# Patient Record
Sex: Male | Born: 1945 | Race: White | Hispanic: No | Smoking: Never smoker
Health system: Southern US, Community
[De-identification: ages and names within clinical notes are randomized; demographics above are authoritative.]

## PROBLEM LIST (undated history)

## (undated) DIAGNOSIS — M1990 Unspecified osteoarthritis, unspecified site: Secondary | ICD-10-CM

## (undated) DIAGNOSIS — E119 Type 2 diabetes mellitus without complications: Secondary | ICD-10-CM

## (undated) DIAGNOSIS — K769 Liver disease, unspecified: Secondary | ICD-10-CM

## (undated) DIAGNOSIS — E785 Hyperlipidemia, unspecified: Secondary | ICD-10-CM

## (undated) DIAGNOSIS — T7840XA Allergy, unspecified, initial encounter: Secondary | ICD-10-CM

## (undated) DIAGNOSIS — I10 Essential (primary) hypertension: Secondary | ICD-10-CM

## (undated) HISTORY — PX: HAND SURGERY: SHX662

## (undated) HISTORY — DX: Type 2 diabetes mellitus without complications: E11.9

## (undated) HISTORY — DX: Essential (primary) hypertension: I10

## (undated) HISTORY — DX: Allergy, unspecified, initial encounter: T78.40XA

## (undated) HISTORY — DX: Liver disease, unspecified: K76.9

## (undated) HISTORY — DX: Hyperlipidemia, unspecified: E78.5

## (undated) HISTORY — PX: FOOT SURGERY: SHX648

## (undated) HISTORY — PX: OTHER SURGICAL HISTORY: SHX169

## (undated) HISTORY — DX: Unspecified osteoarthritis, unspecified site: M19.90

## (undated) HISTORY — PX: DUPUYTREN CONTRACTURE RELEASE: SHX1478

---
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
## 1984-11-25 HISTORY — PX: VASECTOMY: SHX75

## 1996-11-25 HISTORY — PX: ANTERIOR FUSION CERVICAL SPINE: SUR626

## 2000-11-25 HISTORY — PX: KNEE ARTHROSCOPY WITH MEDIAL MENISECTOMY: SHX5651

## 2001-11-25 HISTORY — PX: ROTATOR CUFF REPAIR: SHX139

## 2002-11-25 HISTORY — PX: CATARACT EXTRACTION: SUR2

## 2016-03-03 HISTORY — PX: ULNAR TUNNEL RELEASE: SHX820

## 2019-02-19 HISTORY — PX: XI ROBOTIC ASSISTED SIMPLE PROSTATECTOMY: SHX6713

## 2019-07-16 HISTORY — PX: PIP JOINT FUSION: SHX2238

## 2020-12-22 ENCOUNTER — Telehealth: Payer: Self-pay

## 2020-12-22 NOTE — Telephone Encounter (Signed)
Called pt to let him know that we are currently not accepting new pt's.

## 2020-12-22 NOTE — Telephone Encounter (Signed)
Copied from [place] [id number]. Topic: General - Other >> Dec 22, 2020  2:23 PM Antonieta Iba C wrote: Reason for CRM: pt is calling in to schedule a np apt with a provider, pt says that he doesn't have a preference.  Pt says that he is new to the area and has [employer].    CB: [phone] okay to lvm --please assist pt further.

## 2020-12-28 DIAGNOSIS — R69 Illness, unspecified: Secondary | ICD-10-CM | POA: Diagnosis not present

## 2020-12-29 ENCOUNTER — Ambulatory Visit (INDEPENDENT_AMBULATORY_CARE_PROVIDER_SITE_OTHER): Payer: Medicare HMO | Admitting: Family Medicine

## 2020-12-29 ENCOUNTER — Other Ambulatory Visit: Payer: Self-pay

## 2020-12-29 ENCOUNTER — Encounter: Payer: Self-pay | Admitting: Family Medicine

## 2020-12-29 VITALS — BP 143/96 | HR 130 | Ht 75.0 in | Wt 215.6 lb

## 2020-12-29 DIAGNOSIS — F5101 Primary insomnia: Secondary | ICD-10-CM

## 2020-12-29 DIAGNOSIS — Z7689 Persons encountering health services in other specified circumstances: Secondary | ICD-10-CM | POA: Diagnosis not present

## 2020-12-29 DIAGNOSIS — R69 Illness, unspecified: Secondary | ICD-10-CM | POA: Diagnosis not present

## 2020-12-29 DIAGNOSIS — I10 Essential (primary) hypertension: Secondary | ICD-10-CM | POA: Insufficient documentation

## 2020-12-29 MED ORDER — ZOLPIDEM TARTRATE 10 MG PO TABS: 10.0000 mg | ORAL_TABLET | Freq: Every evening | ORAL | 3 refills | Status: DC | PRN

## 2020-12-29 MED ORDER — CYCLOBENZAPRINE HCL 10 MG PO TABS: 10.0000 mg | ORAL_TABLET | Freq: Three times a day (TID) | ORAL | 0 refills | Status: DC | PRN

## 2020-12-29 NOTE — Assessment & Plan Note (Signed)
Has been on [place] for 23 years. Refilled ambien.

## 2020-12-29 NOTE — Progress Notes (Signed)
Established Patient Office Visit  SUBJECTIVE:  Subjective  Patient ID: Joseph Howell, male    DOB: [birth year]  Age: 75 y.o. MRN: [id number]  CC:  Chief Complaint  Patient presents with  . New Patient (Initial Visit)    New patient to establish care     HPI Joseph Howell is a 75 y.o. male presenting today for     Past Medical History:  Diagnosis Date  . Allergy   . Arthritis   . Diabetes mellitus without complication ([place])   . Hyperlipidemia   . Hypertension   . Liver disease, unspecified     Past Surgical History:  Procedure Laterality Date  . [place]   C-5/6 & 6/7  . CATARACT EXTRACTION  2004  . 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 HAND SURGERY  [phone]    Multiple (Dupuytrens Contracture)  . KNEE ARTHROSCOPY WITH MEDIAL MENISECTOMY Left 2002  . PIP JOINT FUSION Right 07/16/2019  . ROTATOR CUFF REPAIR Left 2003  . TONSILLECTOMY AND ADENOIDECTOMY  1951  . ULNAR TUNNEL RELEASE Left 03/03/2016  . VASECTOMY  1986  . XI ROBOTIC ASSISTED SIMPLE PROSTATECTOMY  02/19/2019    Family History  Adopted: Yes  Family history unknown: Yes    Social History   Socioeconomic History  . Marital status: Divorced    Spouse name: Not on file  . Number of children: Not on file  . Years of education: Not on file  . Highest education level: Not on file  Occupational History  . Not on file  Tobacco Use  . Smoking status: Never Smoker  . Smokeless tobacco: Never Used  Vaping Use  . Vaping Use: Never used  Substance and Sexual Activity  . Alcohol use: Yes    Comment: 2 daily  . Drug use: Never  . Sexual activity: Not Currently  Other Topics Concern  . Not on file  Social History Narrative  . Not on file   Social Determinants of Health   Financial Resource Strain: Not on file  Food Insecurity: Not on file  Transportation Needs: Not on file   Physical Activity: Not on file  Stress: Not on file  Social Connections: Not on file  Intimate Partner Violence: Not on file     Current Outpatient Medications:  .  cyclobenzaprine (FLEXERIL) 10 MG tablet, Take 1 tablet (10 mg total) by mouth 3 (three) times daily as needed for muscle spasms., Disp: 30 tablet, Rfl: 0 .  lisinopril (ZESTRIL) 20 MG tablet, Take 20 mg by mouth daily., Disp: , Rfl:  .  metFORMIN (GLUCOPHAGE) 500 MG tablet, Take 500 mg by mouth daily., Disp: , Rfl:  .  zolpidem (AMBIEN) 10 MG tablet, Take 1 tablet (10 mg total) by mouth at bedtime as needed., Disp: 30 tablet, Rfl: 3   No Known Allergies  ROS Review of Systems  Constitutional: Negative.   HENT: Negative.   Respiratory: Negative.   Cardiovascular: Negative.   Gastrointestinal: Negative.   Genitourinary: Negative.   Musculoskeletal: Negative.   Skin: Negative.   Neurological: Negative.   Psychiatric/Behavioral: Negative.      OBJECTIVE:    Physical Exam Vitals and nursing note reviewed.  HENT:     Head: Normocephalic.     Right Ear: Tympanic membrane normal.     Left Ear: Tympanic membrane normal.  Cardiovascular:     Rate and Rhythm: Normal rate  and regular rhythm.     Pulses: Normal pulses.  Musculoskeletal:     Cervical back: Normal range of motion.  Skin:    General: Skin is warm.     Capillary Refill: Capillary refill takes less than 2 seconds.  Neurological:     Mental Status: He is alert.  Psychiatric:        Mood and Affect: Mood normal.     BP (!) 143/96   Pulse (!) 130   Ht _0  (1.905 m)   Wt 215 lb 9.6 oz (97.8 kg)   BMI 26.95 kg/m  Wt Readings from Last 3 Encounters:  12/29/20 215 lb 9.6 oz (97.8 kg)    Health Maintenance Due  Topic Date Due  . Hepatitis C Screening  Never done  . COLONOSCOPY (Pts 45-38yr Insurance coverage will need to be confirmed)  Never done  . PNA vac Low Risk Adult (1 of 2 - PCV13) Never done  . COVID-19 Vaccine (3 - Moderna risk  4-dose series) 02/09/2020  . INFLUENZA VACCINE  06/25/2020    There are no preventive care reminders to display for this patient.  No flowsheet data found. No flowsheet data found.  No results found for: TSH No results found for: ALBUMIN, ANIONGAP, EGFR, GFR No results found for: CHOL, HDL, LDLCALC, CHOLHDL No results found for: TRIG No results found for: HGBA1C    ASSESSMENT & PLAN:   Problem List Items Addressed This Visit      Cardiovascular and Mediastinum   Primary hypertension    Not at goal today, taking all meds, fu 3 months.       Relevant Medications   lisinopril (ZESTRIL) 20 MG tablet     Other   Encounter to establish care - Primary   Primary insomnia    Has been on ambien for 23 years. Refilled ambien.          Meds ordered this encounter  Medications  . zolpidem (AMBIEN) 10 MG tablet    Sig: Take 1 tablet (10 mg total) by mouth at bedtime as needed.    Dispense:  30 tablet    Refill:  3  . cyclobenzaprine (FLEXERIL) 10 MG tablet    Sig: Take 1 tablet (10 mg total) by mouth 3 (three) times daily as needed for muscle spasms.    Dispense:  30 tablet    Refill:  0      Follow-up: No follow-ups on file.    KBeckie Salts [id number] [place] [id number]

## 2020-12-29 NOTE — Assessment & Plan Note (Addendum)
Not at goal today, taking all meds, fu 3 months. Discussed diet and decreased salt intake.

## 2021-01-01 DIAGNOSIS — M720 Palmar fascial fibromatosis [Dupuytren]: Secondary | ICD-10-CM | POA: Diagnosis not present

## 2021-02-06 DIAGNOSIS — Z7984 Long term (current) use of oral hypoglycemic drugs: Secondary | ICD-10-CM | POA: Diagnosis not present

## 2021-02-06 DIAGNOSIS — G8918 Other acute postprocedural pain: Secondary | ICD-10-CM | POA: Diagnosis not present

## 2021-02-06 DIAGNOSIS — E119 Type 2 diabetes mellitus without complications: Secondary | ICD-10-CM | POA: Diagnosis not present

## 2021-02-06 DIAGNOSIS — I10 Essential (primary) hypertension: Secondary | ICD-10-CM | POA: Diagnosis not present

## 2021-02-06 DIAGNOSIS — E785 Hyperlipidemia, unspecified: Secondary | ICD-10-CM | POA: Diagnosis not present

## 2021-02-06 DIAGNOSIS — M720 Palmar fascial fibromatosis [Dupuytren]: Secondary | ICD-10-CM | POA: Diagnosis not present

## 2021-02-06 DIAGNOSIS — Z79899 Other long term (current) drug therapy: Secondary | ICD-10-CM | POA: Diagnosis not present

## 2021-02-26 DIAGNOSIS — M720 Palmar fascial fibromatosis [Dupuytren]: Secondary | ICD-10-CM | POA: Diagnosis not present

## 2021-02-26 DIAGNOSIS — M6281 Muscle weakness (generalized): Secondary | ICD-10-CM | POA: Diagnosis not present

## 2021-03-05 ENCOUNTER — Ambulatory Visit: Payer: Medicare HMO | Admitting: Occupational Therapy

## 2021-03-05 DIAGNOSIS — H0100A Unspecified blepharitis right eye, upper and lower eyelids: Secondary | ICD-10-CM | POA: Diagnosis not present

## 2021-03-06 ENCOUNTER — Encounter: Payer: Self-pay | Admitting: Occupational Therapy

## 2021-03-06 ENCOUNTER — Ambulatory Visit: Payer: Medicare HMO | Attending: Orthopedic Surgery | Admitting: Occupational Therapy

## 2021-03-06 ENCOUNTER — Other Ambulatory Visit: Payer: Self-pay

## 2021-03-06 DIAGNOSIS — M720 Palmar fascial fibromatosis [Dupuytren]: Secondary | ICD-10-CM | POA: Insufficient documentation

## 2021-03-06 DIAGNOSIS — M25641 Stiffness of right hand, not elsewhere classified: Secondary | ICD-10-CM

## 2021-03-06 DIAGNOSIS — M79641 Pain in right hand: Secondary | ICD-10-CM

## 2021-03-06 DIAGNOSIS — M6281 Muscle weakness (generalized): Secondary | ICD-10-CM | POA: Diagnosis not present

## 2021-03-06 DIAGNOSIS — L905 Scar conditions and fibrosis of skin: Secondary | ICD-10-CM | POA: Diagnosis not present

## 2021-03-06 NOTE — Therapy (Signed)
[facility] [address] [place], [address] Phone: [phone]   Fax:  [phone]  Occupational Therapy Treatment  Patient Details  Name: Joseph Howell MRN: [id number] Date of Birth: 03-01-1946 Referring Provider (OT): DR Draeger   Encounter Date: 03/06/2021   OT End of Session - 03/06/21 1214    Visit Number 1    Number of Visits 12    Date for OT Re-Evaluation 05/01/21    OT Start Time 0950    OT Stop Time 1035    OT Time Calculation (min) 45 min    Activity Tolerance Patient tolerated treatment well    Behavior During Therapy [facility] for tasks assessed/performed           Past Medical History:  Diagnosis Date  . Allergy   . Arthritis   . Diabetes mellitus without complication ([place])   . Hyperlipidemia   . Hypertension   . Liver disease, unspecified     Past Surgical History:  Procedure Laterality Date  . [place]   C-5/6 & 6/7  . CATARACT EXTRACTION  2004  . 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 dupuytrens release R hand 02/06/21 Right   . 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 HAND SURGERY  [phone]    Multiple (Dupuytrens Contracture)  . KNEE ARTHROSCOPY WITH MEDIAL MENISECTOMY Left 2002  . PIP JOINT FUSION Right 07/16/2019  . ROTATOR CUFF REPAIR Left 2003  . TONSILLECTOMY AND ADENOIDECTOMY  1951  . ULNAR TUNNEL RELEASE Left 03/03/2016  . VASECTOMY  1986  . XI ROBOTIC ASSISTED SIMPLE PROSTATECTOMY  02/19/2019    There were no vitals filed for this visit.   Subjective Assessment - 03/06/21 1225    Subjective  I had several surgeries since my lat 20's for this dupuytrens in both hands -about every 10 yrs- very happy with this surgery - need you help with scar massage and motion    Pertinent History 02/06/21 had Successful right thumb Dupuytren's fasciectomy with excision of large pretendinous cord and nodule  with successful primary closure while protecting the radial and ulnar neurovascular bundles. Successful right small finger Dupuytren's fasciectomy with excision of large ADM cord and pretendinous/spiral cord that was excised while protecting the radial and ulnar neurovascular bundles. Successful right ring finger palmar Dupuytren's fasciectomy with excision of taut pretendinous cord while protecting the radial and ulnar neurovascular bundles.  - 02/26/21 stitches come out and seen OT for splint for night time 3-4 months to wear and HEP started -and refer to OT    Patient Stated Goals Want to be able to use my hands again to grasp things, wash face, donn gloves and maybe work litlte on the side again    Currently in Pain? Yes    Pain Score 1     Pain Location Hand    Pain Orientation Right    Pain Descriptors / Indicators Sore    Pain Type Surgical pain    Pain Onset 1 to 4 weeks ago    Pain Frequency Intermittent              [facility] OT Assessment - 03/06/21 0001      Assessment   Medical Diagnosis R hand thumb , 4th and 5th digit dupuytrens release    Referring Provider (OT) DR Draeger    Onset Date/Surgical Date 02/06/21    Hand Dominance Right  Next MD Visit 03/26/21      Precautions   Required Braces or Orthoses --   extention splint to hand night time     Home  Environment   Lives With Alone      Prior Function   Vocation Retired    [employer] Ret[employer] , moved last year from [place],      Right Hand AROM   R Thumb Radial ABduction/ADduction 0-55 52    R Thumb Palmar ABduction/ADduction 0-45 70    R Thumb Opposition to Index --   opposition to base of 5th   R Index  MCP 0-90 80 Degrees    R Index PIP 0-100 90 Degrees    R Long  MCP 0-90 80 Degrees    R Long PIP 0-100 100 Degrees    R Ring  MCP 0-90 80 Degrees   -20   R Ring PIP 0-100 100 Degrees   -20   R Little  MCP 0-90 75 Degrees   -35   R Little PIP 0-100 65 Degrees   -20                   OT  Treatments/Exercises (OP) - 03/06/21 0001      RUE Contrast Bath   Time 8 minutes    Comments LMB splint on 5th for PIP extention           Contrast 3 x day Scar massage -and night time cica scar pad and silicon sleeve on 5th digit USE LMB splint on 5th PIP during contrast or after wards - 3 min at time  To 5 min pain free  PROM and extention stretch to diigts on table  And then AAROM on table  Rolling over putty for extention and massage to volar scars 20 reps pain free  Tendon glides AROM - 10 reps And opposition to all digits AROM - slide down 5th  PA and RA of thumb - 10 reps  2-3 x day          OT Education - 03/06/21 1214    Education Details findings of eval and HEP    Methods Explanation;Demonstration;Tactile cues;Verbal cues;Handout    Comprehension Verbal cues required;Returned demonstration;Verbalized understanding            OT Short Term Goals - 03/06/21 1220      OT SHORT TERM GOAL #1   Title Exention of R digits improve to WNL to be able to wash face and donn gloves    Baseline MC 4th -20 , PIP -20 and 5th MC -35, PIP -20 - had several surgies thru the years - for dupuytrens    Time 4    Period Weeks    Status New    Target Date 04/03/21             OT Long Term Goals - 03/06/21 1221      OT LONG TERM GOAL #1   Title R hand scar tissue and edema decrease for pt to show increase AROM to be able to touch palm    Baseline 5th MC flexion 75, PIP 65 , and MC's of other digits 80    Time 4    Period Weeks    Status New    Target Date 04/03/21      OT LONG TERM GOAL #2   Title Pt AROM in R hand improve for pt to touch palm to use in ADL's/IADlLs  and grasp utencils and  and tools , unpack boxes    Baseline AROM 5th PIP 65, MC 75 , other MC's 80's -and scar still thick and tender after some ROM and massage    Time 6    Period Weeks    Status New    Target Date 04/17/21      OT LONG TERM GOAL #3   Title R grip and prehension strength  increase to morethan 75% compare to L hand to grip tools and utenciles and return to prior level of function    Baseline scars still thick , and 1 scabs - decrease extention and flexion - soreness and edema - today 4  wks s/p    Time 8    Period Weeks    Status New    Target Date 05/01/21                 Plan - 03/06/21 1230    Clinical Impression Statement Pt present at OT eval 4 wks s/p R Successful right thumb Dupuytren's fasciectomy with excision of large pretendinous cord and nodule with successful primary closure while protecting the radial and ulnar neurovascular bundles. Successful right small finger Dupuytren's fasciectomy with excision of large ADM cord and pretendinous/spiral cord that was excised while protecting the radial and ulnar neurovascular bundles. Successful right ring finger palmar Dupuytren's fasciectomy with excision of taut pretendinous cord while protecting the radial and ulnar neurovascular bundles. Had in past several surgeries for same diagnosis- pt present at OT eval with scar tissue , decrease extnetion and lfexion of 5th more than other MC's - soreness and edema - decreasing his use of R dominant hand in ADL's and IADL's - pt can benefit from OT services    OT Occupational Profile and History Problem Focused Assessment - Including review of records relating to presenting problem    Occupational performance deficits (Please refer to evaluation for details): ADL's;IADL's;Leisure;Social Participation;Play    Body Structure / Function / Physical Skills ADL;Flexibility;Sensation;Scar mobility;ROM;Edema;Strength;Pain;UE functional use    Rehab Potential Good    Clinical Decision Making Limited treatment options, no task modification necessary    Comorbidities Affecting Occupational Performance: May have comorbidities impacting occupational performance   had several surgeries for same diagnosis   OT Frequency 2x / week    OT Duration 8 weeks    OT  Treatment/Interventions Self-care/ADL training;Paraffin;Moist Heat;Fluidtherapy;Contrast Bath;Ultrasound;Therapeutic exercise;Splinting;Patient/family education;Manual Therapy;Scar mobilization    Consulted and Agree with Plan of Care Patient           Patient will benefit from skilled therapeutic intervention in order to improve the following deficits and impairments:   Body Structure / Function / Physical Skills: ADL,Flexibility,Sensation,Scar mobility,ROM,Edema,Strength,Pain,UE functional use       Visit Diagnosis: Dupuytren contracture - Plan: Ot plan of care cert/re-cert  Stiffness of right hand, not elsewhere classified - Plan: Ot plan of care cert/re-cert  Scar condition and fibrosis of skin - Plan: Ot plan of care cert/re-cert  Pain in right hand - Plan: Ot plan of care cert/re-cert  Muscle weakness (generalized) - Plan: Ot plan of care cert/re-cert    Problem List Patient Active Problem List   Diagnosis Date Noted  . Encounter to establish care 12/29/2020  . Primary insomnia 12/29/2020  . Primary hypertension 12/29/2020    Rosalyn Gess OTR/l,CLT 03/06/2021, 12:35 PM  [facility] [address], [place], [address] Phone: [phone]   Fax:  [phone]  Name: Joseph Howell MRN: [id number]  Date of Birth: February 09, 1946

## 2021-03-06 NOTE — Patient Instructions (Signed)
Contrast 3 x day Scar massage -and night time cica scar pad and silicon sleeve on 5th digit USE LMB splint on 5th PIP during contrast or after wards - 3 min at time  To 5 min pain free  PROM and extention stretch to diigts on table  And then AAROM on table  Rolling over putty for extention and massage to volar scars 20 reps pain free  Tendon glides AROM - 10 reps And opposition to all digits AROM - slide down 5th  PA and RA of thumb - 10 reps  2-3 x day

## 2021-03-12 ENCOUNTER — Ambulatory Visit: Payer: Medicare HMO | Admitting: Occupational Therapy

## 2021-03-12 ENCOUNTER — Other Ambulatory Visit: Payer: Self-pay

## 2021-03-12 DIAGNOSIS — M79641 Pain in right hand: Secondary | ICD-10-CM | POA: Diagnosis not present

## 2021-03-12 DIAGNOSIS — M720 Palmar fascial fibromatosis [Dupuytren]: Secondary | ICD-10-CM | POA: Diagnosis not present

## 2021-03-12 DIAGNOSIS — M6281 Muscle weakness (generalized): Secondary | ICD-10-CM | POA: Diagnosis not present

## 2021-03-12 DIAGNOSIS — L905 Scar conditions and fibrosis of skin: Secondary | ICD-10-CM | POA: Diagnosis not present

## 2021-03-12 DIAGNOSIS — M25641 Stiffness of right hand, not elsewhere classified: Secondary | ICD-10-CM

## 2021-03-14 ENCOUNTER — Encounter: Payer: Self-pay | Admitting: Occupational Therapy

## 2021-03-14 NOTE — Therapy (Signed)
[facility] [address] [place], [address] Phone: [phone]   Fax:  [phone]  Occupational Therapy Treatment  Patient Details  Name: Joseph Howell MRN: [id number] Date of Birth: 1946-05-15 Referring Provider (OT): DR Draeger   Encounter Date: 03/12/2021   OT End of Session - 03/14/21 2143    Visit Number 2    Number of Visits 12    Date for OT Re-Evaluation 05/01/21    OT Start Time 1346    OT Stop Time 1430    OT Time Calculation (min) 44 min    Activity Tolerance Patient tolerated treatment well    Behavior During Therapy [facility] for tasks assessed/performed           Past Medical History:  Diagnosis Date  . Allergy   . Arthritis   . Diabetes mellitus without complication ([place])   . Hyperlipidemia   . Hypertension   . Liver disease, unspecified     Past Surgical History:  Procedure Laterality Date  . [place]   C-5/6 & 6/7  . CATARACT EXTRACTION  2004  . 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 dupuytrens release R hand 02/06/21 Right   . 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 HAND SURGERY  [phone]    Multiple (Dupuytrens Contracture)  . KNEE ARTHROSCOPY WITH MEDIAL MENISECTOMY Left 2002  . PIP JOINT FUSION Right 07/16/2019  . ROTATOR CUFF REPAIR Left 2003  . TONSILLECTOMY AND ADENOIDECTOMY  1951  . ULNAR TUNNEL RELEASE Left 03/03/2016  . VASECTOMY  1986  . XI ROBOTIC ASSISTED SIMPLE PROSTATECTOMY  02/19/2019    There were no vitals filed for this visit.   Subjective Assessment - 03/14/21 2139    Subjective  Pt reports he has had several surgeries over the years for his Dupuytren's contractures, he is very pleased with his last surgery and the outcome.    Pertinent History 02/06/21 had Successful right thumb Dupuytren's fasciectomy with excision of large pretendinous cord and nodule with successful primary  closure while protecting the radial and ulnar neurovascular bundles. Successful right small finger Dupuytren's fasciectomy with excision of large ADM cord and pretendinous/spiral cord that was excised while protecting the radial and ulnar neurovascular bundles. Successful right ring finger palmar Dupuytren's fasciectomy with excision of taut pretendinous cord while protecting the radial and ulnar neurovascular bundles.  - 02/26/21 stitches come out and seen OT for splint for night time 3-4 months to wear and HEP started -and refer to OT    Patient Stated Goals Want to be able to use my hands again to grasp things, wash face, donn gloves and maybe work litlte on the side again    Pain Score 0-No pain    Pain Onset 1 to 4 weeks ago              [facility] OT Assessment - 03/14/21 0001      Assessment   Medical Diagnosis R hand thumb , 4th and 5th digit dupuytrens release    Referring Provider (OT) DR Draeger    Onset Date/Surgical Date 02/06/21    Hand Dominance Right    Next MD Visit 03/26/21      Right Hand AROM   R Index  MCP 0-90 80 Degrees    R Index PIP 0-100 90 Degrees    R Long  MCP 0-90 80 Degrees    R Long PIP 0-100  90 Degrees    R Ring  MCP 0-90 80 Degrees   -25 degrees   R Ring PIP 0-100 90 Degrees   -10 degrees   R Little  MCP 0-90 75 Degrees   -40 degrees   R Little PIP 0-100 65 Degrees   -10 degrees          Patient seen for edema control with use of constrast utilizing fluidotherapy and cold pack alternating to decrease pain and increase motion.  Fluido for 3 mins, cold for 2 mins, alternating.  Total fluido therapy time 10 mins (3, 3, 4) Cold for 4 mins total (2, 2)  Manual therapy for soft tissue mobilizations, manual scar massage performed.  Use of vibration and graston tool nr2 to scar to decrease fibrosis/scar tissue, improve blood flow and to help improve ROM, functional hand use.    Therapeutic Exercise: Pt seen for focus on ROM for finger extension, review and  reinstruction of home exercises, PROM extension stretch to digits, AAROM of digits into extension on table, tendon gliding exercises for 10 reps, oppositional thumb movements to each digit.  PA/RA of thumb for 10reps.   Pt using CICA care at home, LMB splint to small finger 3 to 5 mins.     Response to tx: Patient progressing well in all areas.  Pt performing scar massage at home, use of CICA care at night, has LMB splint for small finger.  Able to demonstrate exercises with cues focus on extension of digits.  Pain decreased.  Continue to work towards goals in plan of care to maximize safety and independence in necessary daily tasks.              OT Education - 03/14/21 2143    Education Details HEP    Methods Explanation;Demonstration;Tactile cues;Verbal cues;Handout    Comprehension Verbal cues required;Returned demonstration;Verbalized understanding            OT Short Term Goals - 03/06/21 1220      OT SHORT TERM GOAL #1   Title Exention of R digits improve to WNL to be able to wash face and donn gloves    Baseline MC 4th -20 , PIP -20 and 5th MC -35, PIP -20 - had several surgies thru the years - for dupuytrens    Time 4    Period Weeks    Status New    Target Date 04/03/21             OT Long Term Goals - 03/06/21 1221      OT LONG TERM GOAL #1   Title R hand scar tissue and edema decrease for pt to show increase AROM to be able to touch palm    Baseline 5th MC flexion 75, PIP 65 , and MC's of other digits 80    Time 4    Period Weeks    Status New    Target Date 04/03/21      OT LONG TERM GOAL #2   Title Pt AROM in R hand improve for pt to touch palm to use in ADL's/IADlLs  and grasp utencils and and tools , unpack boxes    Baseline AROM 5th PIP 65, MC 75 , other MC's 80's -and scar still thick and tender after some ROM and massage    Time 6    Period Weeks    Status New    Target Date 04/17/21      OT LONG TERM GOAL #3   Title  R grip and prehension  strength increase to morethan 75% compare to L hand to grip tools and utenciles and return to prior level of function    Baseline scars still thick , and 1 scabs - decrease extention and flexion - soreness and edema - today 4  wks s/p    Time 8    Period Weeks    Status New    Target Date 05/01/21                 Plan - 03/14/21 2143    OT Occupational Profile and History Problem Focused Assessment - Including review of records relating to presenting problem    Occupational performance deficits (Please refer to evaluation for details): ADL's;IADL's;Leisure;Social Participation;Play    Body Structure / Function / Physical Skills ADL;Flexibility;Sensation;Scar mobility;ROM;Edema;Strength;Pain;UE functional use    Rehab Potential Good    Clinical Decision Making Limited treatment options, no task modification necessary    Comorbidities Affecting Occupational Performance: May have comorbidities impacting occupational performance   had several surgeries for same diagnosis   OT Frequency 2x / week    OT Duration 8 weeks    OT Treatment/Interventions Self-care/ADL training;Paraffin;Moist Heat;Fluidtherapy;Contrast Bath;Ultrasound;Therapeutic exercise;Splinting;Patient/family education;Manual Therapy;Scar mobilization    Consulted and Agree with Plan of Care Patient           Patient will benefit from skilled therapeutic intervention in order to improve the following deficits and impairments:   Body Structure / Function / Physical Skills: ADL,Flexibility,Sensation,Scar mobility,ROM,Edema,Strength,Pain,UE functional use       Visit Diagnosis: Stiffness of right hand, not elsewhere classified  Dupuytren contracture  Scar condition and fibrosis of skin  Pain in right hand  Muscle weakness (generalized)    Problem List Patient Active Problem List   Diagnosis Date Noted  . Encounter to establish care 12/29/2020  . Primary insomnia 12/29/2020  . Primary hypertension  12/29/2020   Kylar Speelman T Seretha Estabrooks, OTR/L, CLT  Dakia Schifano 03/14/2021, 10:08 PM  [facility] [address], [place], [address] Phone: [phone]   Fax:  [phone]  Name: Nils Thor MRN: [id number] Date of Birth: [birth year]

## 2021-03-15 ENCOUNTER — Ambulatory Visit: Payer: Medicare HMO | Admitting: Occupational Therapy

## 2021-03-22 ENCOUNTER — Ambulatory Visit: Payer: Medicare HMO | Admitting: Occupational Therapy

## 2021-03-22 DIAGNOSIS — M6281 Muscle weakness (generalized): Secondary | ICD-10-CM

## 2021-03-22 DIAGNOSIS — L905 Scar conditions and fibrosis of skin: Secondary | ICD-10-CM

## 2021-03-22 DIAGNOSIS — M720 Palmar fascial fibromatosis [Dupuytren]: Secondary | ICD-10-CM | POA: Diagnosis not present

## 2021-03-22 DIAGNOSIS — M25641 Stiffness of right hand, not elsewhere classified: Secondary | ICD-10-CM

## 2021-03-22 DIAGNOSIS — M79641 Pain in right hand: Secondary | ICD-10-CM | POA: Diagnosis not present

## 2021-03-22 NOTE — Therapy (Signed)
[facility] [address] [place], [address] Phone: [phone]   Fax:  [phone]  Occupational Therapy Treatment  Patient Details  Name: Lino Wickliff MRN: [id number] Date of Birth: [birth year] Referring Provider (OT): DR Draeger   Encounter Date: 03/22/2021   OT End of Session - 03/22/21 1236    Visit Number 3    Number of Visits 12    Date for OT Re-Evaluation 05/01/21    OT Start Time 7209    OT Stop Time 1322    OT Time Calculation (min) 46 min    Activity Tolerance Patient tolerated treatment well    Behavior During Therapy [facility] for tasks assessed/performed           Past Medical History:  Diagnosis Date  . Allergy   . Arthritis   . Diabetes mellitus without complication ([place])   . Hyperlipidemia   . Hypertension   . Liver disease, unspecified     Past Surgical History:  Procedure Laterality Date  . [place]   C-5/6 & 6/7  . CATARACT EXTRACTION  2004  . 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 dupuytrens release R hand 02/06/21 Right   . 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 HAND SURGERY  [id number]    Multiple (Dupuytrens Contracture)  . KNEE ARTHROSCOPY WITH MEDIAL MENISECTOMY Left 2002  . PIP JOINT FUSION Right 07/16/2019  . ROTATOR CUFF REPAIR Left 2003  . TONSILLECTOMY AND ADENOIDECTOMY  1951  . ULNAR TUNNEL RELEASE Left 03/03/2016  . VASECTOMY  1986  . XI ROBOTIC ASSISTED SIMPLE PROSTATECTOMY  02/19/2019    There were no vitals filed for this visit.   Subjective Assessment - 03/22/21 1236    Subjective  I am very happy with the progress still -but do not like the splint - my appt with Dr on Monday    Pertinent History 02/06/21 had Successful right thumb Dupuytren's fasciectomy with excision of large pretendinous cord and nodule with successful primary closure while protecting the radial and ulnar  neurovascular bundles. Successful right small finger Dupuytren's fasciectomy with excision of large ADM cord and pretendinous/spiral cord that was excised while protecting the radial and ulnar neurovascular bundles. Successful right ring finger palmar Dupuytren's fasciectomy with excision of taut pretendinous cord while protecting the radial and ulnar neurovascular bundles.  - 02/26/21 stitches come out and seen OT for splint for night time 3-4 months to wear and HEP started -and refer to OT    Patient Stated Goals Want to be able to use my hands again to grasp things, wash face, donn gloves and maybe work litlte on the side again    Currently in Pain? Yes    Pain Score 5     Pain Location Finger (Comment which one)   5th DIP   Pain Orientation Right    Pain Descriptors / Indicators Sore    Pain Type Surgical pain    Pain Onset More than a month ago    Aggravating Factors  Tender DIP joint              [facility] OT Assessment - 03/22/21 0001      Right Hand AROM   R Ring  MCP 0-90 90 Degrees   -30   R Ring PIP 0-100 100 Degrees   -10   R Little  MCP 0-90 90 Degrees   -30   R  Little PIP 0-100 65 Degrees   -15               modify splint for night time to hand base -and pt to check with surgeon if can do dorsal hand base with only 4th and 5th included- pt hit himself with splint over head at night time - per pt    OT Treatments/Exercises (OP) - 03/22/21 0001      RUE Fluidotherapy   Number Minutes Fluidotherapy 8 Minutes    Comments ice 2 x 2 min - decrease pain DIP of 5th piror to ROM            cont with Contrast or heat prior to HEP 3 x day Scar massage done by OT manually and mini massager -and review again with pt  Issued new cica scar pad and silicon sleeve on 5th digit Cont to use LMB splint on 5th PIP during contrast or after wards - 3 min at time  To 5 min pain free  PROM and extention stretch to diigts on table - review with pt to do pain free- had increase pain  since last night per pt on DIP - tender lateral DIP - appear bruised And then AAROM on table  Rolling over putty for extention and massage to volar scars to cont with  20 reps pain free  Tendon glides AROM - 10 reps And opposition to all digits AROM - slide down 5th  PA and RA of thumb - 10 reps - great progress - WNL  2-3 x day        OT Education - 03/22/21 1236    Education Details progress and HEP    Person(s) Educated Patient    Methods Explanation;Demonstration;Tactile cues;Verbal cues;Handout    Comprehension Verbal cues required;Returned demonstration;Verbalized understanding            OT Short Term Goals - 03/06/21 1220      OT SHORT TERM GOAL #1   Title Exention of R digits improve to WNL to be able to wash face and donn gloves    Baseline MC 4th -20 , PIP -20 and 5th MC -35, PIP -20 - had several surgies thru the years - for dupuytrens    Time 4    Period Weeks    Status New    Target Date 04/03/21             OT Long Term Goals - 03/06/21 1221      OT LONG TERM GOAL #1   Title R hand scar tissue and edema decrease for pt to show increase AROM to be able to touch palm    Baseline 5th MC flexion 75, PIP 65 , and MC's of other digits 80    Time 4    Period Weeks    Status New    Target Date 04/03/21      OT LONG TERM GOAL #2   Title Pt AROM in R hand improve for pt to touch palm to use in ADL's/IADlLs  and grasp utencils and and tools , unpack boxes    Baseline AROM 5th PIP 65, MC 75 , other MC's 80's -and scar still thick and tender after some ROM and massage    Time 6    Period Weeks    Status New    Target Date 04/17/21      OT LONG TERM GOAL #3   Title R grip and prehension strength increase to morethan 75% compare  to L hand to grip tools and utenciles and return to prior level of function    Baseline scars still thick , and 1 scabs - decrease extention and flexion - soreness and edema - today 4  wks s/p    Time 8    Period Weeks    Status  New    Target Date 05/01/21                 Plan - 03/22/21 1236    Clinical Impression Statement Pt present at OT eval 6 wks s/p R Successful right thumb Dupuytren's fasciectomy with excision of large pretendinous cord and nodule with successful primary closure while protecting the radial and ulnar neurovascular bundles. Successful right small finger Dupuytren's fasciectomy with excision of large ADM cord and pretendinous/spiral cord that was excised while protecting the radial and ulnar neurovascular bundles. Successful right ring finger palmar Dupuytren's fasciectomy with excision of taut pretendinous cord while protecting the radial and ulnar neurovascular bundles. Had in past several surgeries for same diagnosis- pt making grear progress in scar tissue adhesions, fibrosis , AROM extention and flexion and functional use - AROM WNL in thumb , and WFL in 4th - still decrease at PIP of 5th -- did modify pt's splint to hand base splint and pt to check with surgeon if splint can be made smaller and not include thumb - pt can benefit from OT services    OT Occupational Profile and History Problem Focused Assessment - Including review of records relating to presenting problem    Occupational performance deficits (Please refer to evaluation for details): ADL's;IADL's;Leisure;Social Participation;Play    Body Structure / Function / Physical Skills ADL;Flexibility;Sensation;Scar mobility;ROM;Edema;Strength;Pain;UE functional use    Rehab Potential Good    Clinical Decision Making Limited treatment options, no task modification necessary    Comorbidities Affecting Occupational Performance: May have comorbidities impacting occupational performance    Modification or Assistance to Complete Evaluation  No modification of tasks or assist necessary to complete eval    OT Frequency 2x / week    OT Duration 8 weeks    OT Treatment/Interventions Self-care/ADL training;Paraffin;Moist  Heat;Fluidtherapy;Contrast Bath;Ultrasound;Therapeutic exercise;Splinting;Patient/family education;Manual Therapy;Scar mobilization    Consulted and Agree with Plan of Care Patient           Patient will benefit from skilled therapeutic intervention in order to improve the following deficits and impairments:   Body Structure / Function / Physical Skills: ADL,Flexibility,Sensation,Scar mobility,ROM,Edema,Strength,Pain,UE functional use       Visit Diagnosis: Dupuytren contracture  Stiffness of right hand, not elsewhere classified  Scar condition and fibrosis of skin  Pain in right hand  Muscle weakness (generalized)    Problem List Patient Active Problem List   Diagnosis Date Noted  . Encounter to establish care 12/29/2020  . Primary insomnia 12/29/2020  . Primary hypertension 12/29/2020    Rosalyn Gess  OTR/L,CLT 03/22/2021, 4:28 PM  [place] [facility] [address] [place], [address] Phone: [phone]   Fax:  [phone]  Name: Enmanuel Zufall MRN: [id number] Date of Birth: [birth year]

## 2021-03-24 ENCOUNTER — Other Ambulatory Visit: Payer: Self-pay | Admitting: Family Medicine

## 2021-03-26 DIAGNOSIS — M720 Palmar fascial fibromatosis [Dupuytren]: Secondary | ICD-10-CM | POA: Diagnosis not present

## 2021-03-27 ENCOUNTER — Ambulatory Visit: Payer: Medicare HMO | Attending: Orthopedic Surgery | Admitting: Occupational Therapy

## 2021-03-27 ENCOUNTER — Other Ambulatory Visit: Payer: Self-pay

## 2021-03-27 ENCOUNTER — Other Ambulatory Visit: Payer: Self-pay | Admitting: *Deleted

## 2021-03-27 DIAGNOSIS — M79641 Pain in right hand: Secondary | ICD-10-CM | POA: Diagnosis not present

## 2021-03-27 DIAGNOSIS — M720 Palmar fascial fibromatosis [Dupuytren]: Secondary | ICD-10-CM | POA: Diagnosis not present

## 2021-03-27 DIAGNOSIS — L905 Scar conditions and fibrosis of skin: Secondary | ICD-10-CM | POA: Diagnosis not present

## 2021-03-27 DIAGNOSIS — M25641 Stiffness of right hand, not elsewhere classified: Secondary | ICD-10-CM | POA: Diagnosis not present

## 2021-03-27 DIAGNOSIS — M6281 Muscle weakness (generalized): Secondary | ICD-10-CM

## 2021-03-27 MED ORDER — ONETOUCH DELICA PLUS LANCET33G MISC: 2 refills | Status: DC

## 2021-03-27 MED ORDER — LISINOPRIL 20 MG PO TABS: 1.0000 | ORAL_TABLET | Freq: Every day | ORAL | 3 refills | Status: DC

## 2021-03-27 MED ORDER — METFORMIN HCL 500 MG PO TABS: 1.0000 | ORAL_TABLET | Freq: Every day | ORAL | 3 refills | Status: AC

## 2021-03-27 MED ORDER — ONETOUCH VERIO VI STRP: ORAL_STRIP | 1 refills | Status: DC

## 2021-03-27 MED ORDER — ATORVASTATIN CALCIUM 10 MG PO TABS: 1.0000 | ORAL_TABLET | Freq: Every day | ORAL | 3 refills | Status: AC

## 2021-03-27 NOTE — Therapy (Signed)
[facility] [address], [place], [address] Phone: [phone]   Fax:  [phone]  Occupational Therapy Treatment  Patient Details  Name: Joseph Howell MRN: [id number] Date of Birth: Nov 01, 1946 Referring Provider (OT): DR Draeger   Encounter Date: 03/27/2021   OT End of Session - 03/27/21 6712    Visit Number 4    Number of Visits 12    Date for OT Re-Evaluation 05/01/21    OT Start Time 1315    OT Stop Time 1400    OT Time Calculation (min) 45 min    Activity Tolerance Patient tolerated treatment well    Behavior During Therapy [facility] for tasks assessed/performed           Past Medical History:  Diagnosis Date  . Allergy   . Arthritis   . Diabetes mellitus without complication ([place])   . Hyperlipidemia   . Hypertension   . Liver disease, unspecified     Past Surgical History:  Procedure Laterality Date  . [place]   C-5/6 & 6/7  . CATARACT EXTRACTION  2004  . 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 dupuytrens release R hand 02/06/21 Right   . 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 HAND SURGERY  [phone]    Multiple (Dupuytrens Contracture)  . KNEE ARTHROSCOPY WITH MEDIAL MENISECTOMY Left 2002  . PIP JOINT FUSION Right 07/16/2019  . ROTATOR CUFF REPAIR Left 2003  . TONSILLECTOMY AND ADENOIDECTOMY  1951  . ULNAR TUNNEL RELEASE Left 03/03/2016  . VASECTOMY  1986  . XI ROBOTIC ASSISTED SIMPLE PROSTATECTOMY  02/19/2019    There were no vitals filed for this visit.   Subjective Assessment - 03/27/21 1402    Subjective  I am very happy with my results - using it more and seen surgeon yesterday - thinking of having other hand done- he says can make splint smaller and back of hand if needed    Pertinent History 02/06/21 had Successful right thumb Dupuytren's fasciectomy with excision of large pretendinous cord and nodule  with successful primary closure while protecting the radial and ulnar neurovascular bundles. Successful right small finger Dupuytren's fasciectomy with excision of large ADM cord and pretendinous/spiral cord that was excised while protecting the radial and ulnar neurovascular bundles. Successful right ring finger palmar Dupuytren's fasciectomy with excision of taut pretendinous cord while protecting the radial and ulnar neurovascular bundles.  - 02/26/21 stitches come out and seen OT for splint for night time 3-4 months to wear and HEP started -and refer to OT    Patient Stated Goals Want to be able to use my hands again to grasp things, wash face, donn gloves and maybe work litlte on the side again    Currently in Pain? No/denies                   AROM same as last time- PIP 5th digit PROM in session 85 - and pain still at DIP of 5th - and somewhat bruise - pt report improving      OT Treatments/Exercises (OP) - 03/27/21 0001      RUE Contrast Bath   Time 8 minutes    Comments prior to scar massage and during splint making           cont with Contrast or heat prior to HEP 3 x day Scar massage done by OT manually and mini  massager -and review again with pt  Issued new cica scar pad and silicon sleeve on 5th digit Cont to use LMB splint on 5th PIP during contrast or after wards - 3 min at time  To 5 min pain free  PROM and extention stretch to diigts on table - review with pt to do pain free- had increase pain since last night per pt on DIP - tender lateral DIP - appear bruised And then AAROM on table  Rolling over putty for extention and massage to volar scars to cont with  20 reps pain free  Tendon glides AROM - 10 reps And opposition to all digits AROM - slide down 5th  PA and RA of thumb - 10 reps - great progress - WNL  2-3 x day  Fabricated new hand base dorsal splint for 4th and 5th in full extention - to use night time - surgeon okay it per pt in session - can  exclude thumb  AROM of thumb great         OT Education - 03/27/21 1403    Education Details progress and HEP    Person(s) Educated Patient    Methods Explanation;Demonstration;Tactile cues;Verbal cues;Handout    Comprehension Verbal cues required;Returned demonstration;Verbalized understanding            OT Short Term Goals - 03/27/21 1406      OT SHORT TERM GOAL #1   Title Exention of R digits improve to WNL to be able to wash face and donn gloves    Baseline MC 4th -20 , PIP -20 and 5th MC -35, PIP -20 - had several surgies thru the years - for dupuytrens-- improve greatly - to -10 to -30 in 4th and 5th    Status Achieved             OT Long Term Goals - 03/27/21 1406      OT LONG TERM GOAL #1   Title R hand scar tissue and edema decrease for pt to show increase AROM to be able to touch palm    Baseline 5th MC flexion 75, PIP 65 , and MC's of other digits 80  NOW -improve to WNL - except 5th MC 90, PIP 65    Time 1    Period Weeks    Status On-going    Target Date 04/03/21      OT LONG TERM GOAL #2   Title Pt AROM in R hand improve for pt to touch palm to use in ADL's/IADlLs  and grasp utencils and and tools , unpack boxes    Baseline AROM 5th PIP 65, MC 75 , other MC's 80's -and scar still thick and tender after some ROM and massage  NOW able to use in all activities - except heavy or repetitive and 5th PIP 65 - rest WNL    Time 3    Period Weeks    Status On-going    Target Date 04/17/21      OT LONG TERM GOAL #3   Title R grip and prehension strength increase to morethan 75% compare to L hand to grip tools and utenciles and return to prior level of function    Baseline scars still thick , and 1 scabs - decrease extention and flexion - soreness and edema - today 4  wks s/p  NOW NT - will do next session    Time 4    Period Weeks    Status On-going    Target  Date 05/01/21                 Plan - 03/27/21 1405    OT Occupational Profile and  History Problem Focused Assessment - Including review of records relating to presenting problem    Occupational performance deficits (Please refer to evaluation for details): ADL's;IADL's;Leisure;Social Participation;Play    Body Structure / Function / Physical Skills ADL;Flexibility;Sensation;Scar mobility;ROM;Edema;Strength;Pain;UE functional use    Rehab Potential Good    Clinical Decision Making Limited treatment options, no task modification necessary    Comorbidities Affecting Occupational Performance: May have comorbidities impacting occupational performance    Modification or Assistance to Complete Evaluation  No modification of tasks or assist necessary to complete eval    OT Frequency Biweekly    OT Duration 4 weeks   5 wks   OT Treatment/Interventions Self-care/ADL training;Paraffin;Moist Heat;Fluidtherapy;Contrast Bath;Ultrasound;Therapeutic exercise;Splinting;Patient/family education;Manual Therapy;Scar mobilization    Consulted and Agree with Plan of Care Patient           Patient will benefit from skilled therapeutic intervention in order to improve the following deficits and impairments:   Body Structure / Function / Physical Skills: ADL,Flexibility,Sensation,Scar mobility,ROM,Edema,Strength,Pain,UE functional use       Visit Diagnosis: Dupuytren contracture  Stiffness of right hand, not elsewhere classified  Scar condition and fibrosis of skin  Muscle weakness (generalized)  Pain in right hand    Problem List Patient Active Problem List   Diagnosis Date Noted  . Encounter to establish care 12/29/2020  . Primary insomnia 12/29/2020  . Primary hypertension 12/29/2020    Rosalyn Gess  OTR/L,CLT 03/27/2021, 2:09 PM  [facility] [address] [place], [address] Phone: [phone]   Fax:  [phone]  Name: Joseph Howell MRN: [id number] Date of Birth: [birth year]

## 2021-03-29 ENCOUNTER — Ambulatory Visit: Payer: Medicare HMO | Admitting: Family Medicine

## 2021-03-30 ENCOUNTER — Ambulatory Visit: Payer: Medicare HMO | Admitting: Family Medicine

## 2021-03-30 ENCOUNTER — Ambulatory Visit: Payer: Medicare HMO | Admitting: Occupational Therapy

## 2021-03-30 ENCOUNTER — Other Ambulatory Visit: Payer: Self-pay

## 2021-04-04 DIAGNOSIS — E119 Type 2 diabetes mellitus without complications: Secondary | ICD-10-CM | POA: Diagnosis not present

## 2021-04-06 DIAGNOSIS — D2271 Melanocytic nevi of right lower limb, including hip: Secondary | ICD-10-CM | POA: Diagnosis not present

## 2021-04-06 DIAGNOSIS — D2262 Melanocytic nevi of left upper limb, including shoulder: Secondary | ICD-10-CM | POA: Diagnosis not present

## 2021-04-06 DIAGNOSIS — L570 Actinic keratosis: Secondary | ICD-10-CM | POA: Diagnosis not present

## 2021-04-06 DIAGNOSIS — X32XXXA Exposure to sunlight, initial encounter: Secondary | ICD-10-CM | POA: Diagnosis not present

## 2021-04-06 DIAGNOSIS — D2261 Melanocytic nevi of right upper limb, including shoulder: Secondary | ICD-10-CM | POA: Diagnosis not present

## 2021-04-06 DIAGNOSIS — D225 Melanocytic nevi of trunk: Secondary | ICD-10-CM | POA: Diagnosis not present

## 2021-04-06 DIAGNOSIS — D2272 Melanocytic nevi of left lower limb, including hip: Secondary | ICD-10-CM | POA: Diagnosis not present

## 2021-04-06 DIAGNOSIS — L718 Other rosacea: Secondary | ICD-10-CM | POA: Diagnosis not present

## 2021-04-10 ENCOUNTER — Ambulatory Visit: Payer: Medicare HMO | Admitting: Occupational Therapy

## 2021-04-10 ENCOUNTER — Other Ambulatory Visit: Payer: Self-pay

## 2021-04-10 DIAGNOSIS — M79641 Pain in right hand: Secondary | ICD-10-CM

## 2021-04-10 DIAGNOSIS — M6281 Muscle weakness (generalized): Secondary | ICD-10-CM

## 2021-04-10 DIAGNOSIS — M25641 Stiffness of right hand, not elsewhere classified: Secondary | ICD-10-CM | POA: Diagnosis not present

## 2021-04-10 DIAGNOSIS — M720 Palmar fascial fibromatosis [Dupuytren]: Secondary | ICD-10-CM

## 2021-04-10 DIAGNOSIS — L905 Scar conditions and fibrosis of skin: Secondary | ICD-10-CM | POA: Diagnosis not present

## 2021-04-10 NOTE — Therapy (Signed)
[facility] [address], [place], [address] Phone: [phone]   Fax:  [phone]  Occupational Therapy Treatment  Patient Details  Name: Joseph Howell MRN: [id number] Date of Birth: Oct 13, 1946 Referring Provider (OT): DR Draeger   Encounter Date: 04/10/2021   OT End of Session - 04/10/21 1303    Visit Number 5    Number of Visits 12    Date for OT Re-Evaluation 05/01/21    OT Start Time 1230    OT Stop Time 1302    OT Time Calculation (min) 32 min    Activity Tolerance Patient tolerated treatment well    Behavior During Therapy [facility] for tasks assessed/performed           Past Medical History:  Diagnosis Date  . Allergy   . Arthritis   . Diabetes mellitus without complication ([place])   . Hyperlipidemia   . Hypertension   . Liver disease, unspecified     Past Surgical History:  Procedure Laterality Date  . [place]   C-5/6 & 6/7  . CATARACT EXTRACTION  2004  . 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 dupuytrens release R hand 02/06/21 Right   . 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 HAND SURGERY  [phone]    Multiple (Dupuytrens Contracture)  . KNEE ARTHROSCOPY WITH MEDIAL MENISECTOMY Left 2002  . PIP JOINT FUSION Right 07/16/2019  . ROTATOR CUFF REPAIR Left 2003  . TONSILLECTOMY AND ADENOIDECTOMY  1951  . ULNAR TUNNEL RELEASE Left 03/03/2016  . VASECTOMY  1986  . XI ROBOTIC ASSISTED SIMPLE PROSTATECTOMY  02/19/2019    There were no vitals filed for this visit.   Subjective Assessment - 04/10/21 1259    Subjective  I am having my L hand done Friday - mainly I think the middle finger - R hand doing well - wanted you to just change one strap on splint -and take measurements of L fingers prior to my surgery    Pertinent History 02/06/21 had Successful right thumb Dupuytren's fasciectomy with excision of large  pretendinous cord and nodule with successful primary closure while protecting the radial and ulnar neurovascular bundles. Successful right small finger Dupuytren's fasciectomy with excision of large ADM cord and pretendinous/spiral cord that was excised while protecting the radial and ulnar neurovascular bundles. Successful right ring finger palmar Dupuytren's fasciectomy with excision of taut pretendinous cord while protecting the radial and ulnar neurovascular bundles.  - 02/26/21 stitches come out and seen OT for splint for night time 3-4 months to wear and HEP started -and refer to OT    Patient Stated Goals Want to be able to use my hands again to grasp things, wash face, donn gloves and maybe work litlte on the side again    Currently in Pain? No/denies              [facility] OT Assessment - 04/10/21 0001      Strength   Right Hand Grip (lbs) 75    Right Hand Lateral Pinch 16 lbs    Right Hand 3 Point Pinch 17 lbs    Left Hand Grip (lbs) 76    Left Hand Lateral Pinch 14 lbs    Left Hand 3 Point Pinch 16 lbs      Right Hand AROM   R Thumb Radial ABduction/ADduction 0-55 60    R Thumb Palmar ABduction/ADduction 0-45 70  R Index  MCP 0-90 90 Degrees    R Index PIP 0-100 100 Degrees    R Long  MCP 0-90 90 Degrees    R Long PIP 0-100 100 Degrees    R Ring  MCP 0-90 90 Degrees   -30   R Ring PIP 0-100 100 Degrees   -10   R Little  MCP 0-90 90 Degrees   -20   R Little PIP 0-100 65 Degrees   -10     Left Hand AROM   L Thumb MCP 0-60 --   -15   L Thumb Radial ADduction/ABduction 0-55 65    L Thumb Palmar ADduction/ABduction 0-45 70    L Index  MCP 0-90 85 Degrees   -55   L Index PIP 0-100 100 Degrees    L Long  MCP 0-90 85 Degrees   -60   L Long PIP 0-100 100 Degrees   -10   L Ring  MCP 0-90 90 Degrees   -45   L Ring PIP 0-100 100 Degrees    L Little  MCP 0-90 90 Degrees   -5   L Little PIP 0-100 100 Degrees   -40          great progress in flexion- WNL except 5th PIP on R  -and grip / prehension assess- see flow sheet- within range for his age      Pt arrive - for measurements of R hand - if maintaining progress with HEP and splint wearing-  Pt to wear splint night time until about 12 wks s/p   and then pre measurement taken - prior to L hand surgery in 3 days -same procedure   Scar massage pt to cont at home with - more soreness then pain per pt   Cont with cica scar pad and silicon sleeve on 5th digit or coban as needed   PROM and extention stretch to diigts on table- review with pt to do pain free- And then AAROM on table  Rolling over putty for extention and massage to volar scarsto cont with 20 reps pain free  Tendon glides AROM - 10 reps And opposition to all digits AROM - slide down 5th  PA and RA of thumb - 10 reps- great progress - WNL 2-3 x day  Pt schedule to return for L hand - after surgery for OT and splinting - after stitches are removed               OT Education - 04/10/21 1303    Education Details findings , HEP , splint wearing    Person(s) Educated Patient    Methods Explanation;Demonstration;Tactile cues;Verbal cues;Handout    Comprehension Verbal cues required;Returned demonstration;Verbalized understanding            OT Short Term Goals - 04/10/21 1309      OT SHORT TERM GOAL #1   Title Exention of R digits improve to WNL to be able to wash face and donn gloves    Baseline MC 4th -20 , PIP -20 and 5th MC -35, PIP -20 - had several surgies thru the years - for dupuytrens-- improve greatly - to -10 to -30 in 4th and 5th    Status Achieved             OT Long Term Goals - 04/10/21 1309      OT LONG TERM GOAL #1   Title R hand scar tissue and edema decrease for pt to show  increase AROM to be able to touch palm    Baseline 5th MC flexion 75, PIP 65 , and MC's of other digits 80  NOW -improve to WNL - except 5th MC 90, PIP 65    Status Partially Met      OT LONG TERM GOAL #2   Title Pt AROM in R  hand improve for pt to touch palm to use in ADL's/IADlLs  and grasp utencils and and tools , unpack boxes    Baseline AROM 5th PIP 65, MC 75 , other MC's 80's -and scar still thick and tender after some ROM and massage  NOW able to use in all activities - WNL except 5th PIP 65 - grip 75lbs    Status Achieved      OT LONG TERM GOAL #3   Title R grip and prehension strength increase to morethan 75% compare to L hand to grip tools and utenciles and return to prior level of function    Baseline scars still thick , and 1 scabs - decrease extention and flexion - soreness and edema - today 4  wks s/p  NOW Grip 75R , 76 L lbs -lat grp R 16, L 14 lbs, 3 point grip R 17, L 16 lbs    Status Achieved                 Plan - 04/10/21 1304    Clinical Impression Statement Pt present at OT eval 8 wks s/p R Successful right thumb Dupuytren's fasciectomy with excision of large pretendinous cord and nodule with successful primary closure while protecting the radial and ulnar neurovascular bundles. Successful right small finger Dupuytren's fasciectomy with excision of large ADM cord and pretendinous/spiral cord that was excised while protecting the radial and ulnar neurovascular bundles. Successful right ring finger palmar Dupuytren's fasciectomy with excision of taut pretendinous cord while protecting the radial and ulnar neurovascular bundles. Had in past several surgeries for same diagnosis- pt making grear progress in scar tissue adhesions, fibrosis , AROM extention and flexion and functional use - AROM WNL in thumb , and flexion improve to WNL except 5th PIP flexion - pt to cont with splint wearing night time - to at least 12 wks post op. Pt having L hand 3rd digit done in 3 days - same procedure- took some pre measurement this date to compare to after surgery - pain and scar tissue improving and grip and prehension on bilateral hands WNL for his age  - pt to contact me for follow up in 2-3 wks when L hand  surgery are done and ready for rehab    OT Occupational Profile and History Problem Focused Assessment - Including review of records relating to presenting problem    Occupational performance deficits (Please refer to evaluation for details): ADL's;IADL's;Leisure;Social Participation;Play    Body Structure / Function / Physical Skills ADL;Flexibility;Sensation;Scar mobility;ROM;Edema;Strength;Pain;UE functional use    Rehab Potential Good    Clinical Decision Making Limited treatment options, no task modification necessary    Comorbidities Affecting Occupational Performance: May have comorbidities impacting occupational performance    Modification or Assistance to Complete Evaluation  No modification of tasks or assist necessary to complete eval    OT Duration 4 weeks    OT Treatment/Interventions Self-care/ADL training;Paraffin;Moist Heat;Fluidtherapy;Contrast Bath;Ultrasound;Therapeutic exercise;Splinting;Patient/family education;Manual Therapy;Scar mobilization    Consulted and Agree with Plan of Care Patient           Patient will benefit from skilled therapeutic intervention in order to  improve the following deficits and impairments:   Body Structure / Function / Physical Skills: ADL,Flexibility,Sensation,Scar mobility,ROM,Edema,Strength,Pain,UE functional use       Visit Diagnosis: Dupuytren contracture  Stiffness of right hand, not elsewhere classified  Scar condition and fibrosis of skin  Muscle weakness (generalized)  Pain in right hand    Problem List Patient Active Problem List   Diagnosis Date Noted  . Encounter to establish care 12/29/2020  . Primary insomnia 12/29/2020  . Primary hypertension 12/29/2020    Rosalyn Gess OTR/L,CLT 04/10/2021, 1:12 PM  [facility] [address], [place], [address] Phone: [phone]   Fax:  [phone]  Name: Joseph Howell MRN: [id number] Date of  Birth: [birth year]

## 2021-04-13 DIAGNOSIS — M720 Palmar fascial fibromatosis [Dupuytren]: Secondary | ICD-10-CM | POA: Diagnosis not present

## 2021-04-13 DIAGNOSIS — Z9079 Acquired absence of other genital organ(s): Secondary | ICD-10-CM | POA: Diagnosis not present

## 2021-04-13 DIAGNOSIS — I10 Essential (primary) hypertension: Secondary | ICD-10-CM | POA: Diagnosis not present

## 2021-04-13 DIAGNOSIS — Z7984 Long term (current) use of oral hypoglycemic drugs: Secondary | ICD-10-CM | POA: Diagnosis not present

## 2021-04-13 DIAGNOSIS — E119 Type 2 diabetes mellitus without complications: Secondary | ICD-10-CM | POA: Diagnosis not present

## 2021-04-13 DIAGNOSIS — G8918 Other acute postprocedural pain: Secondary | ICD-10-CM | POA: Diagnosis not present

## 2021-04-13 DIAGNOSIS — Z981 Arthrodesis status: Secondary | ICD-10-CM | POA: Diagnosis not present

## 2021-04-13 DIAGNOSIS — R000 Tachycardia, unspecified: Secondary | ICD-10-CM | POA: Diagnosis not present

## 2021-04-13 DIAGNOSIS — Z9852 Vasectomy status: Secondary | ICD-10-CM | POA: Diagnosis not present

## 2021-04-13 DIAGNOSIS — E785 Hyperlipidemia, unspecified: Secondary | ICD-10-CM | POA: Diagnosis not present

## 2021-04-13 DIAGNOSIS — K760 Fatty (change of) liver, not elsewhere classified: Secondary | ICD-10-CM | POA: Diagnosis not present

## 2021-04-23 ENCOUNTER — Other Ambulatory Visit: Payer: Self-pay | Admitting: Family Medicine

## 2021-04-27 ENCOUNTER — Other Ambulatory Visit: Payer: Self-pay

## 2021-04-27 ENCOUNTER — Ambulatory Visit (INDEPENDENT_AMBULATORY_CARE_PROVIDER_SITE_OTHER): Payer: Medicare HMO | Admitting: Family Medicine

## 2021-04-27 ENCOUNTER — Encounter: Payer: Self-pay | Admitting: Family Medicine

## 2021-04-27 VITALS — BP 154/82 | HR 53 | Ht 75.0 in | Wt 225.4 lb

## 2021-04-27 DIAGNOSIS — E1169 Type 2 diabetes mellitus with other specified complication: Secondary | ICD-10-CM | POA: Diagnosis not present

## 2021-04-27 DIAGNOSIS — E782 Mixed hyperlipidemia: Secondary | ICD-10-CM | POA: Diagnosis not present

## 2021-04-27 DIAGNOSIS — F5101 Primary insomnia: Secondary | ICD-10-CM | POA: Diagnosis not present

## 2021-04-27 DIAGNOSIS — I10 Essential (primary) hypertension: Secondary | ICD-10-CM | POA: Diagnosis not present

## 2021-04-27 DIAGNOSIS — E118 Type 2 diabetes mellitus with unspecified complications: Secondary | ICD-10-CM

## 2021-04-27 DIAGNOSIS — R69 Illness, unspecified: Secondary | ICD-10-CM | POA: Diagnosis not present

## 2021-04-27 MED ORDER — ZOLPIDEM TARTRATE 10 MG PO TABS: 10.0000 mg | ORAL_TABLET | Freq: Every evening | ORAL | 3 refills | Status: DC | PRN

## 2021-04-27 NOTE — Assessment & Plan Note (Signed)
Patient is completely dependent on Ambien to keep him asleep. He has 1 pill left.

## 2021-04-27 NOTE — Assessment & Plan Note (Signed)
Last A1C 5.7 and 5.3. Diet is going well, diet and medication control.  Plan- Labs today

## 2021-04-27 NOTE — Assessment & Plan Note (Signed)
Taking all meds, labs drawn today.

## 2021-04-27 NOTE — Progress Notes (Signed)
Established Patient Office Visit  SUBJECTIVE:  Subjective  Patient ID: Joseph Howell, male    DOB: Sep 10, 1946  Age: 75 y.o. MRN: [id number]  CC:  Chief Complaint  Patient presents with  . Follow-up    Patient needs refill of ambien and is fasting today and would like to have labs drawn     HPI Roxanne Orner is a 75 y.o. male presenting today for     Past Medical History:  Diagnosis Date  . Allergy   . Arthritis   . Diabetes mellitus without complication ([place])   . Hyperlipidemia   . Hypertension   . Liver disease, unspecified     Past Surgical History:  Procedure Laterality Date  . [place]   C-5/6 & 6/7  . CATARACT EXTRACTION  2004  . 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 dupuytrens release R hand 02/06/21 Right   . 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 HAND SURGERY  [phone]    Multiple (Dupuytrens Contracture)  . KNEE ARTHROSCOPY WITH MEDIAL MENISECTOMY Left 2002  . PIP JOINT FUSION Right 07/16/2019  . ROTATOR CUFF REPAIR Left 2003  . TONSILLECTOMY AND ADENOIDECTOMY  1951  . ULNAR TUNNEL RELEASE Left 03/03/2016  . VASECTOMY  1986  . XI ROBOTIC ASSISTED SIMPLE PROSTATECTOMY  02/19/2019    Family History  Adopted: Yes  Family history unknown: Yes    Social History   Socioeconomic History  . Marital status: Divorced    Spouse name: Not on file  . Number of children: Not on file  . Years of education: Not on file  . Highest education level: Not on file  Occupational History  . Not on file  Tobacco Use  . Smoking status: Never Smoker  . Smokeless tobacco: Never Used  Vaping Use  . Vaping Use: Never used  Substance and Sexual Activity  . Alcohol use: Yes    Comment: 2 daily  . Drug use: Never  . Sexual activity: Not Currently  Other Topics Concern  . Not on file  Social History Narrative  . Not on file   Social Determinants of Health   Financial Resource  Strain: Not on file  Food Insecurity: Not on file  Transportation Needs: Not on file  Physical Activity: Not on file  Stress: Not on file  Social Connections: Not on file  Intimate Partner Violence: Not on file     Current Outpatient Medications:  .  atorvastatin (LIPITOR) 10 MG tablet, Take 1 tablet (10 mg total) by mouth at bedtime., Disp: 90 tablet, Rfl: 3 .  cyclobenzaprine (FLEXERIL) 10 MG tablet, Take 1 tablet (10 mg total) by mouth 3 (three) times daily as needed for muscle spasms., Disp: 30 tablet, Rfl: 0 .  glucose blood (ONETOUCH VERIO) test strip, TO CHECK BEFORE BREAKFAST, Disp: 100 strip, Rfl: 1 .  Lancets (ONETOUCH DELICA PLUS [id number]) MISC, CHECK ONE TIME DAILY BEFORE BREAKFAST, Disp: 100 each, Rfl: 2 .  lisinopril (ZESTRIL) 20 MG tablet, Take 1 tablet (20 mg total) by mouth daily., Disp: 90 tablet, Rfl: 3 .  metFORMIN (GLUCOPHAGE) 500 MG tablet, Take 1 tablet (500 mg total) by mouth daily., Disp: 90 tablet, Rfl: 3 .  zolpidem (AMBIEN) 10 MG tablet, Take 1 tablet (10 mg total) by mouth at bedtime as needed., Disp: 30 tablet, Rfl: 3   No Known Allergies  ROS Review of Systems  Constitutional: Negative.   HENT: Negative.  Respiratory: Negative.   Cardiovascular: Negative.   Genitourinary: Negative.   Musculoskeletal: Negative.   Psychiatric/Behavioral: Negative.      OBJECTIVE:    Physical Exam Vitals and nursing note reviewed.  HENT:     Head: Normocephalic.  Cardiovascular:     Rate and Rhythm: Normal rate and regular rhythm.  Pulmonary:     Effort: Pulmonary effort is normal.  Musculoskeletal:        General: Normal range of motion.     Cervical back: Normal range of motion.  Skin:    General: Skin is warm.  Neurological:     Mental Status: He is alert.  Psychiatric:        Mood and Affect: Mood normal.     BP (!) 154/82   Pulse (!) 53   Ht 6' 3"  (1.905 m)   Wt 225 lb 6.4 oz (102.2 kg)   BMI 28.17 kg/m  Wt Readings from Last 3 Encounters:   04/27/21 225 lb 6.4 oz (102.2 kg)  12/29/20 215 lb 9.6 oz (97.8 kg)    Health Maintenance Due  Topic Date Due  . HEMOGLOBIN A1C  Never done  . Pneumococcal Vaccine 81-42 Years old (1 of 4 - PCV13) Never done  . FOOT EXAM  Never done  . OPHTHALMOLOGY EXAM  Never done  . Hepatitis C Screening  Never done  . COLONOSCOPY (Pts 45-65yr Insurance coverage will need to be confirmed)  Never done  . Zoster Vaccines- Shingrix (1 of 2) Never done  . PNA vac Low Risk Adult (1 of 2 - PCV13) Never done  . COVID-19 Vaccine (3 - Moderna risk 4-dose series) 02/09/2020    There are no preventive care reminders to display for this patient.  No flowsheet data found. No flowsheet data found.  No results found for: TSH No results found for: ALBUMIN, ANIONGAP, EGFR, GFR No results found for: CHOL, HDL, LDLCALC, CHOLHDL No results found for: TRIG No results found for: HGBA1C    ASSESSMENT & PLAN:   Problem List Items Addressed This Visit      Cardiovascular and Mediastinum   Primary hypertension    Patient's blood pressure is not within the desired range.  An office visit is recommended. Medication side effects include: no side effects noted Continue current treatment regimen. Dietary sodium restriction.   Patient has been in increased pain since recent hand surgery, also he has not been sleeping well.          Endocrine   Controlled type 2 diabetes mellitus with complication, without long-term current use of insulin ([facility]) - Primary    Last A1C 5.7 and 5.3. Diet is going well, diet and medication control.  Plan- Labs today        Other   Primary insomnia    Patient is completely dependent on Ambien to keep him asleep. He has 1 pill left.       Moderate mixed hyperlipidemia not requiring statin therapy    Taking all meds, labs drawn today.          No orders of the defined types were placed in this encounter.     Follow-up: No follow-ups on file.    KBeckie Salts  [place] [place] [id number]

## 2021-04-27 NOTE — Assessment & Plan Note (Signed)
Patient's blood pressure is not within the desired range.  An office visit is recommended. Medication side effects include: no side effects noted Continue current treatment regimen. Dietary sodium restriction.   Patient has been in increased pain since recent hand surgery, also he has not been sleeping well.

## 2021-04-28 LAB — CBC WITH DIFFERENTIAL/PLATELET
Absolute Monocytes: 747 cells/uL (ref 200–950)
Basophils Absolute: 90 cells/uL (ref 0–200)
Basophils Relative: 1 %
Eosinophils Absolute: 675 cells/uL — ABNORMAL HIGH (ref 15–500)
Eosinophils Relative: 7.5 %
HCT: 39.8 % (ref 38.5–50.0)
Hemoglobin: 13.3 g/dL (ref 13.2–17.1)
Lymphs Abs: 2655 cells/uL (ref 850–3900)
MCH: 35.1 pg — ABNORMAL HIGH (ref 27.0–33.0)
MCHC: 33.4 g/dL (ref 32.0–36.0)
MCV: 105 fL — ABNORMAL HIGH (ref 80.0–100.0)
MPV: 10.8 fL (ref 7.5–12.5)
Monocytes Relative: 8.3 %
Neutro Abs: 4833 cells/uL (ref 1500–7800)
Neutrophils Relative %: 53.7 %
Platelets: 274 10*3/uL (ref 140–400)
RBC: 3.79 10*6/uL — ABNORMAL LOW (ref 4.20–5.80)
RDW: 11.9 % (ref 11.0–15.0)
Total Lymphocyte: 29.5 %
WBC: 9 10*3/uL (ref 3.8–10.8)

## 2021-04-28 LAB — HEMOGLOBIN A1C
Hgb A1c MFr Bld: 5.5 % of total Hgb (ref <5.7)
Mean Plasma Glucose: 111 mg/dL
eAG (mmol/L): 6.2 mmol/L

## 2021-04-28 LAB — COMPLETE METABOLIC PANEL WITH GFR
AG Ratio: 2 (calc) (ref 1.0–2.5)
ALT: 45 U/L (ref 9–46)
AST: 59 U/L — ABNORMAL HIGH (ref 10–35)
Albumin: 4 g/dL (ref 3.6–5.1)
Alkaline phosphatase (APISO): 75 U/L (ref 35–144)
BUN: 8 mg/dL (ref 7–25)
CO2: 24 mmol/L (ref 20–32)
Calcium: 9 mg/dL (ref 8.6–10.3)
Chloride: 104 mmol/L (ref 98–110)
Creat: 1.14 mg/dL (ref 0.70–1.18)
GFR, Est African American: 74 mL/min/{1.73_m2} (ref >=60)
GFR, Est Non African American: 64 mL/min/{1.73_m2} (ref >=60)
Globulin: 2 g/dL (calc) (ref 1.9–3.7)
Glucose, Bld: 127 mg/dL — ABNORMAL HIGH (ref 65–99)
Potassium: 4.2 mmol/L (ref 3.5–5.3)
Sodium: 140 mmol/L (ref 135–146)
Total Bilirubin: 0.7 mg/dL (ref 0.2–1.2)
Total Protein: 6 g/dL — ABNORMAL LOW (ref 6.1–8.1)

## 2021-04-28 LAB — LIPID PANEL
Cholesterol: 182 mg/dL (ref <200)
HDL: 59 mg/dL (ref >=40)
LDL Cholesterol (Calc): 96 mg/dL (calc)
Non-HDL Cholesterol (Calc): 123 mg/dL (calc) (ref <130)
Total CHOL/HDL Ratio: 3.1 (calc) (ref <5.0)
Triglycerides: 172 mg/dL — ABNORMAL HIGH (ref <150)

## 2021-04-28 LAB — PSA: PSA: <0.04 ng/mL (ref <=4.00)

## 2021-04-30 ENCOUNTER — Ambulatory Visit: Payer: Medicare HMO | Attending: Orthopedic Surgery | Admitting: Occupational Therapy

## 2021-04-30 ENCOUNTER — Encounter: Payer: Self-pay | Admitting: Occupational Therapy

## 2021-04-30 ENCOUNTER — Other Ambulatory Visit: Payer: Self-pay

## 2021-04-30 DIAGNOSIS — M79642 Pain in left hand: Secondary | ICD-10-CM | POA: Diagnosis present

## 2021-04-30 DIAGNOSIS — M720 Palmar fascial fibromatosis [Dupuytren]: Secondary | ICD-10-CM

## 2021-04-30 DIAGNOSIS — L905 Scar conditions and fibrosis of skin: Secondary | ICD-10-CM | POA: Diagnosis present

## 2021-04-30 DIAGNOSIS — M25642 Stiffness of left hand, not elsewhere classified: Secondary | ICD-10-CM | POA: Diagnosis present

## 2021-04-30 DIAGNOSIS — M6281 Muscle weakness (generalized): Secondary | ICD-10-CM | POA: Insufficient documentation

## 2021-04-30 NOTE — Therapy (Signed)
[place] [address], [place], [address] Phone: [phone]   Fax:  [phone]  Occupational Therapy Evaluation  Patient Details  Name: Joseph Howell MRN: [id number] Date of Birth: 1946/02/21 Referring Provider (OT): DR Draeger   Encounter Date: 04/30/2021   OT End of Session - 04/30/21 1902    Visit Number 1    Number of Visits 12    Date for OT Re-Evaluation 06/11/21    OT Start Time 1300    OT Stop Time 1345    OT Time Calculation (min) 45 min    Activity Tolerance Patient tolerated treatment well    Behavior During Therapy [facility] for tasks assessed/performed           Past Medical History:  Diagnosis Date  . Allergy   . Arthritis   . Diabetes mellitus without complication ([place])   . Hyperlipidemia   . Hypertension   . Liver disease, unspecified     Past Surgical History:  Procedure Laterality Date  . [place]   C-5/6 & 6/7  . CATARACT EXTRACTION  2004  . 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 dupuytrens release R hand 02/06/21 Right   . 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 HAND SURGERY  [phone]    Multiple (Dupuytrens Contracture)  . KNEE ARTHROSCOPY WITH MEDIAL MENISECTOMY Left 2002  . PIP JOINT FUSION Right 07/16/2019  . ROTATOR CUFF REPAIR Left 2003  . TONSILLECTOMY AND ADENOIDECTOMY  1951  . ULNAR TUNNEL RELEASE Left 03/03/2016  . VASECTOMY  1986  . XI ROBOTIC ASSISTED SIMPLE PROSTATECTOMY  02/19/2019    There were no vitals filed for this visit.   Subjective Assessment - 04/30/21 1701    Subjective  The Dr thought it was to quick to do my L hand - just had my R hand done -but doing okay - look I can make tight fist-Stitches come out this am -and now here for my splint - he do want the palm to heal from the inside - so now sterri strips- I have these glove I wear that my fingers to not touch each  other- pain with this one much more    Pertinent History Joseph Howell is a 75 y.o. male with history of extensive bilateral Dupuytren's contracture. He recently underwent Dupuytren's fasciectomy by myself on the contralateral upper extremity. He continues to be bothered by contractures primarily at his left long finger but also natatory cords extending to his index and ring fingers. After informing the patient of the benefits and risks of ongoing nonoperative management versus surgical Dupuytren's fasciectomy, she wished to proceed with surgery, Had 5/20 L hand done - stitches out today and refer to hand therapy and OT for splint and OT to increase ROM - and scar tissue    Patient Stated Goals Want to be able to use my hands again to grasp things, wash face, donn gloves and maybe work litlte on the side again    Currently in Pain? Yes    Pain Score 4     Pain Location Hand    Pain Orientation Left    Pain Descriptors / Indicators Aching;Tender;Tightness    Pain Type Surgical pain    Pain Onset 1 to 4 weeks ago    Pain Frequency Constant             [facility] OT Assessment - 04/30/21 0001  Left Hand AROM   L Index  MCP 0-90 85 Degrees   -35   L Index PIP 0-100 95 Degrees    L Long  MCP 0-90 80 Degrees   -35   L Long PIP 0-100 90 Degrees   0   L Ring  MCP 0-90 85 Degrees   -30   L Ring PIP 0-100 95 Degrees    L Little  MCP 0-90 85 Degrees    L Little PIP 0-100 95 Degrees   -32           Pt arrive stitches been removed in am -and soft glove on - pt has many of them - incision open in palm and volar 3rd- per pt surgeon wants it to heal from inside AROM for extention improve already compare to pre measurements Dorsal hand base extention splint fabricated for 2nd thru 4th - to use during night time- straps comfortable -and mold skin inside over [facility] for padding       Pt to do contrast and gentle AROM tendon glides - 10 reps  No force full extention - hyper extention of digits-  incision to heal           OT Education - 04/30/21 1901    Education Details findings of eval and HEP    Person(s) Educated Patient    Methods Explanation;Demonstration;Tactile cues;Verbal cues;Handout    Comprehension Verbal cues required;Returned demonstration;Verbalized understanding            OT Short Term Goals - 04/30/21 1908      OT SHORT TERM GOAL #1   Title Exention of L digits improve to [facility] to be able to wash face and donn gloves    Baseline 2nd MC -35, 3rd -35, 4th -32 --= improved already from pre surgery measurement - incision open- in palm and 3rd digit    Time 4    Period Weeks    Status New    Target Date 05/28/21             OT Long Term Goals - 04/30/21 1911      OT LONG TERM GOAL #1   Title L hand scar tissue and edema decrease for pt to show increase AROM to make tight fist to grip utenciles    Baseline MC's flexion 80-85 and PIP 90 - open incision in palm - and 3rd digit - edema in all digits    Time 5    Period Weeks    Status New    Target Date 06/04/21      OT LONG TERM GOAL #2   Title L hand incision and scar improve for pt to grip objects and use hand in ADL's without increase tenderness or symptoms less than 2/10    Baseline incision still open in volar 3rd and palm - Zscar  in palm into volar 3rd - pain increase per pt - pain medication more than 5/10    Time 6    Period Weeks    Status New    Target Date 06/11/21      OT LONG TERM GOAL #3   Title L grip and prehension strength increase to more than 75% compare to R hand to grip tools and utenciles and return to prior level of function    Baseline open incisions in palm and volar 3rd - NT - 2 1/2 wks s/p    Time 6    Period Weeks    Status New  Target Date 06/11/21                 Plan - 04/30/21 1903    Clinical Impression Statement Pt present at OT eval this date 2 1/2 wks s/p Fasciectomy partial palmar with release of 1 digit with/without Z-plasty, left long  finger Dupuytren's fasciectomy, and Fasciectomy palm only, with/without Z-plasty, left index and ring finger palmar Dupuytren's fasciectomy. Pt stitches removed today and refer to OT for splint for night time, pt with open incision in palm and 3rd digit- but show increase extention in digits compare to prior to surgery- pt show increase edema , pain and decrease flexion - pt in need for OT services to do woundhealing, scar management , splinting increase AROM and strength- pt limited in use of L hand in ADL's and IADL's    OT Occupational Profile and History Problem Focused Assessment - Including review of records relating to presenting problem    Occupational performance deficits (Please refer to evaluation for details): ADL's;IADL's;Leisure;Social Participation;Play    Body Structure / Function / Physical Skills ADL;Flexibility;Sensation;Scar mobility;ROM;Edema;Strength;Pain;UE functional use    Rehab Potential Good    Clinical Decision Making Limited treatment options, no task modification necessary    Comorbidities Affecting Occupational Performance: May have comorbidities impacting occupational performance    Modification or Assistance to Complete Evaluation  No modification of tasks or assist necessary to complete eval    OT Frequency 2x / week    OT Duration 8 weeks    OT Treatment/Interventions Self-care/ADL training;Paraffin;Moist Heat;Fluidtherapy;Contrast Bath;Ultrasound;Therapeutic exercise;Splinting;Patient/family education;Manual Therapy;Scar mobilization    Consulted and Agree with Plan of Care Patient           Patient will benefit from skilled therapeutic intervention in order to improve the following deficits and impairments:   Body Structure / Function / Physical Skills: ADL,Flexibility,Sensation,Scar mobility,ROM,Edema,Strength,Pain,UE functional use       Visit Diagnosis: Dupuytren contracture - Plan: Ot plan of care cert/re-cert  Scar condition and fibrosis of skin -  Plan: Ot plan of care cert/re-cert  Muscle weakness (generalized) - Plan: Ot plan of care cert/re-cert  Stiffness of left hand, not elsewhere classified - Plan: Ot plan of care cert/re-cert  Pain in left hand - Plan: Ot plan of care cert/re-cert    Problem List Patient Active Problem List   Diagnosis Date Noted  . Controlled type 2 diabetes mellitus with complication, without long-term current use of insulin ([place]) 04/27/2021  . Moderate mixed hyperlipidemia not requiring statin therapy 04/27/2021  . Encounter to establish care 12/29/2020  . Primary insomnia 12/29/2020  . Primary hypertension 12/29/2020    Rosalyn Gess OTR/L,CLT 04/30/2021, 7:19 PM  [place] [address] [place], [address] Phone: [phone]   Fax:  [phone]  Name: Joseph Howell MRN: [id number] Date of Birth: 05-05-1946

## 2021-05-07 ENCOUNTER — Ambulatory Visit: Payer: Medicare HMO | Admitting: Occupational Therapy

## 2021-05-07 ENCOUNTER — Other Ambulatory Visit: Payer: Self-pay

## 2021-05-07 DIAGNOSIS — L905 Scar conditions and fibrosis of skin: Secondary | ICD-10-CM | POA: Diagnosis not present

## 2021-05-07 DIAGNOSIS — M720 Palmar fascial fibromatosis [Dupuytren]: Secondary | ICD-10-CM

## 2021-05-07 DIAGNOSIS — M79642 Pain in left hand: Secondary | ICD-10-CM | POA: Diagnosis not present

## 2021-05-07 DIAGNOSIS — M25642 Stiffness of left hand, not elsewhere classified: Secondary | ICD-10-CM

## 2021-05-07 DIAGNOSIS — M6281 Muscle weakness (generalized): Secondary | ICD-10-CM | POA: Diagnosis not present

## 2021-05-07 NOTE — Therapy (Signed)
[place] [facility] [address] [place], [address] Phone: [phone]   Fax:  [phone]  Occupational Therapy Treatment  Patient Details  Name: Joseph Howell MRN: [id number] Date of Birth: 1945/12/10 Referring Provider (OT): DR Draeger   Encounter Date: 05/07/2021   OT End of Session - 05/07/21 1021     Visit Number 2    Number of Visits 12    Date for OT Re-Evaluation 06/11/21    OT Start Time 0955    OT Stop Time 1019    OT Time Calculation (min) 24 min    Activity Tolerance Patient tolerated treatment well    Behavior During Therapy [facility] for tasks assessed/performed             Past Medical History:  Diagnosis Date   Allergy    Arthritis    Diabetes mellitus without complication ([place])    Hyperlipidemia    Hypertension    Liver disease, unspecified     Past Surgical History:  Procedure Laterality Date   [place]   C-5/6 & 6/7   CATARACT EXTRACTION  2004   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 12/21/2013   dupuytrens release R hand 02/06/21 Right    FOOT SURGERY Bilateral in the 90's   Plantar Fibromatosis   HAND SURGERY  [id number]    Multiple (Dupuytrens Contracture)   KNEE ARTHROSCOPY WITH MEDIAL MENISECTOMY Left 2002   PIP JOINT FUSION Right 07/16/2019   ROTATOR CUFF REPAIR Left 2003   TONSILLECTOMY AND ADENOIDECTOMY  1951   ULNAR TUNNEL RELEASE Left 03/03/2016   VASECTOMY  1986   XI ROBOTIC ASSISTED SIMPLE PROSTATECTOMY  02/19/2019    There were no vitals filed for this visit.   Subjective Assessment - 05/07/21 1020     Subjective  Doing good - my motion better and scar healing well - still wearing these little gloves- pain better since yesterday - but have this bad head cold today    Pertinent History Joseph Howell is a 75 y.o. male with history of extensive bilateral Dupuytren's contracture. He recently underwent Dupuytren's fasciectomy  by myself on the contralateral upper extremity. He continues to be bothered by contractures primarily at his left long finger but also natatory cords extending to his index and ring fingers. After informing the patient of the benefits and risks of ongoing nonoperative management versus surgical Dupuytren's fasciectomy, she wished to proceed with surgery, Had 5/20 L hand done - stitches out today and refer to hand therapy and OT for splint and OT to increase ROM - and scar tissue    Patient Stated Goals Want to be able to use my hands again to grasp things, wash face, donn gloves and maybe work litlte on the side again    Currently in Pain? Yes    Pain Score 1     Pain Location Hand    Pain Orientation Left    Pain Descriptors / Indicators Aching;Tightness;Tender    Pain Type Surgical pain    Pain Onset More than a month ago    Pain Frequency Intermittent                [facility] OT Assessment - 05/07/21 0001       Left Hand AROM   L Index  MCP 0-90 90 Degrees   -35   L Index PIP 0-100 90 Degrees    L Long  MCP 0-90 90 Degrees   -  35   L Long PIP 0-100 92 Degrees   0   L Ring  MCP 0-90 90 Degrees   -28   L Ring PIP 0-100 100 Degrees    L Little  MCP 0-90 90 Degrees    L Little PIP 0-100 100 Degrees   -30               Pt arrive with soft glove on - pt has many of them - incision healing very well and closing well in palm and on 3rd digit 2 small scabs  - assess AROM flexion and extention   Great progress - see flow sheet Assess scar and ed on light scar massage   Dorsal hand base extention splint fabricated for 2nd thru 4th last visit - to use during night time- straps comfortable -and mold skin inside over [facility] for padding Report no issues        Pt to do contrast and gentle AROM tendon glides - 10 reps And can start light rolling over foam roller - if no issues or pulling on incision in palm No force full extention                 OT Education - 05/07/21  1021     Education Details progress and HEP    Person(s) Educated Patient    Methods Explanation;Demonstration;Tactile cues;Verbal cues;Handout    Comprehension Verbal cues required;Returned demonstration;Verbalized understanding              OT Short Term Goals - 04/30/21 1908       OT SHORT TERM GOAL #1   Title Exention of L digits improve to [facility] to be able to wash face and donn gloves    Baseline 2nd MC -35, 3rd -35, 4th -32 --= improved already from pre surgery measurement - incision open- in palm and 3rd digit    Time 4    Period Weeks    Status New    Target Date 05/28/21               OT Long Term Goals - 04/30/21 1911       OT LONG TERM GOAL #1   Title L hand scar tissue and edema decrease for pt to show increase AROM to make tight fist to grip utenciles    Baseline MC's flexion 80-85 and PIP 90 - open incision in palm - and 3rd digit - edema in all digits    Time 5    Period Weeks    Status New    Target Date 06/04/21      OT LONG TERM GOAL #2   Title L hand incision and scar improve for pt to grip objects and use hand in ADL's without increase tenderness or symptoms less than 2/10    Baseline incision still open in volar 3rd and palm - Zscar  in palm into volar 3rd - pain increase per pt - pain medication more than 5/10    Time 6    Period Weeks    Status New    Target Date 06/11/21      OT LONG TERM GOAL #3   Title L grip and prehension strength increase to more than 75% compare to R hand to grip tools and utenciles and return to prior level of function    Baseline open incisions in palm and volar 3rd - NT - 2 1/2 wks s/p    Time 6    Period Weeks  Status New    Target Date 06/11/21                   Plan - 05/07/21 1022     Clinical Impression Statement Pt present at OT 3 1/2 wks s/p Fasciectomy partial palmar with release of 1 digit with/without Z-plasty, left long finger Dupuytren's fasciectomy, and Fasciectomy palm only,  with/without Z-plasty, left index and ring finger palmar Dupuytren's fasciectomy. Pt stitches removed last week  and refer to OT for splint for night time, pt with open incision in palm still but closing and 3rd digit healing well - 2 small scabs - tolerate extention splint at night time and pain better- pt show increase extention in digits compare to last week as well as flexion -  pt in need for cont OT services to do woundhealing, scar management , splinting increase AROM and strength- pt limited in use of L hand in ADL's and IADL's    OT Occupational Profile and History Problem Focused Assessment - Including review of records relating to presenting problem    Occupational performance deficits (Please refer to evaluation for details): ADL's;IADL's;Leisure;Social Participation;Play    Body Structure / Function / Physical Skills ADL;Flexibility;Sensation;Scar mobility;ROM;Edema;Strength;Pain;UE functional use    Rehab Potential Good    Clinical Decision Making Limited treatment options, no task modification necessary    Comorbidities Affecting Occupational Performance: May have comorbidities impacting occupational performance    Modification or Assistance to Complete Evaluation  No modification of tasks or assist necessary to complete eval    OT Frequency 1x / week    OT Duration 8 weeks    OT Treatment/Interventions Self-care/ADL training;Paraffin;Moist Heat;Fluidtherapy;Contrast Bath;Ultrasound;Therapeutic exercise;Splinting;Patient/family education;Manual Therapy;Scar mobilization    Consulted and Agree with Plan of Care Patient             Patient will benefit from skilled therapeutic intervention in order to improve the following deficits and impairments:   Body Structure / Function / Physical Skills: ADL, Flexibility, Sensation, Scar mobility, ROM, Edema, Strength, Pain, UE functional use       Visit Diagnosis: Scar condition and fibrosis of skin  Dupuytren contracture  Muscle  weakness (generalized)  Stiffness of left hand, not elsewhere classified    Problem List Patient Active Problem List   Diagnosis Date Noted   Controlled type 2 diabetes mellitus with complication, without long-term current use of insulin ([place]) 04/27/2021   Moderate mixed hyperlipidemia not requiring statin therapy 04/27/2021   Encounter to establish care 12/29/2020   Primary insomnia 12/29/2020   Primary hypertension 12/29/2020    Rosalyn Gess OTR/L,CLT 05/07/2021, 10:26 AM  [place] [facility] [address], [place], [address] Phone: [phone]   Fax:  [phone]  Name: Joseph Howell MRN: [id number] Date of Birth: 1946/01/14

## 2021-05-14 ENCOUNTER — Other Ambulatory Visit: Payer: Self-pay

## 2021-05-14 ENCOUNTER — Ambulatory Visit: Payer: Medicare HMO | Admitting: Occupational Therapy

## 2021-05-14 DIAGNOSIS — M720 Palmar fascial fibromatosis [Dupuytren]: Secondary | ICD-10-CM

## 2021-05-14 DIAGNOSIS — M79642 Pain in left hand: Secondary | ICD-10-CM | POA: Diagnosis not present

## 2021-05-14 DIAGNOSIS — L905 Scar conditions and fibrosis of skin: Secondary | ICD-10-CM | POA: Diagnosis not present

## 2021-05-14 DIAGNOSIS — M6281 Muscle weakness (generalized): Secondary | ICD-10-CM | POA: Diagnosis not present

## 2021-05-14 DIAGNOSIS — M25642 Stiffness of left hand, not elsewhere classified: Secondary | ICD-10-CM | POA: Diagnosis not present

## 2021-05-14 NOTE — Therapy (Signed)
[facility] [address] [place], [address] Phone: [phone]   Fax:  [phone]  Occupational Therapy Treatment  Patient Details  Name: Joseph Howell MRN: [id number] Date of Birth: 10-31-1946 Referring Provider (OT): DR Draeger   Encounter Date: 05/14/2021   OT End of Session - 05/14/21 1409     Visit Number 3    Number of Visits 12    Date for OT Re-Evaluation 06/11/21    OT Start Time 1345    OT Stop Time 1406    OT Time Calculation (min) 21 min    Activity Tolerance Patient tolerated treatment well    Behavior During Therapy [facility] for tasks assessed/performed             Past Medical History:  Diagnosis Date   Allergy    Arthritis    Diabetes mellitus without complication ([place])    Hyperlipidemia    Hypertension    Liver disease, unspecified     Past Surgical History:  Procedure Laterality Date   [place]   C-5/6 & 6/7   CATARACT EXTRACTION  2004   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 12/21/2013   dupuytrens release R hand 02/06/21 Right    FOOT SURGERY Bilateral in the 90's   Plantar Fibromatosis   HAND SURGERY  [id number]    Multiple (Dupuytrens Contracture)   KNEE ARTHROSCOPY WITH MEDIAL MENISECTOMY Left 2002   PIP JOINT FUSION Right 07/16/2019   ROTATOR CUFF REPAIR Left 2003   TONSILLECTOMY AND ADENOIDECTOMY  1951   ULNAR TUNNEL RELEASE Left 03/03/2016   VASECTOMY  1986   XI ROBOTIC ASSISTED SIMPLE PROSTATECTOMY  02/19/2019    There were no vitals filed for this visit.   Subjective Assessment - 05/14/21 1408     Subjective  I drove with my hand today- sensation better and scar softer - and scab only the one the the middle - doing better -getting more sensation in pinkie on R hand    Pertinent History Joseph Howell is a 75 y.o. male with history of extensive bilateral Dupuytren's contracture. He recently underwent Dupuytren's  fasciectomy by myself on the contralateral upper extremity. He continues to be bothered by contractures primarily at his left long finger but also natatory cords extending to his index and ring fingers. After informing the patient of the benefits and risks of ongoing nonoperative management versus surgical Dupuytren's fasciectomy, she wished to proceed with surgery, Had 5/20 L hand done - stitches out today and refer to hand therapy and OT for splint and OT to increase ROM - and scar tissue    Patient Stated Goals Want to be able to use my hands again to grasp things, wash face, donn gloves and maybe work litlte on the side again    Currently in Pain? No/denies                [facility] OT Assessment - 05/14/21 0001       Right Hand AROM   R Ring  MCP 0-90 --   -30   R Ring PIP 0-100 --   -15   R Little  MCP 0-90 90 Degrees   -20   R Little PIP 0-100 80 Degrees   -10     Left Hand AROM   L Index  MCP 0-90 --   0   L Long  MCP 0-90 90 Degrees   0 ext   L Long  PIP 0-100 95 Degrees   0 ext   L Ring  MCP 0-90 --   0 ext   L Little PIP 0-100 --   -30               Pt arrive with great progress in scar healing - only one scab on palm -and scar tissue softer and increase extention and flexion of digits - fitted with cica scar pad to start using when scab comes off and silicon sleeve on 3rd digit for edema and scar tissue    Great progress in extention and flexion - see flow sheet Review  scar massage    Dorsal hand base extention splint fabricated for 2nd thru 4th after surgery - to use during night time- straps comfortable -and mold skin inside over [facility] for padding Report no issues        Pt to do contrast and gentle AROM tendon glides  but pt to focus on intrinsic and pen in palm during composite flexion to block MC at 90 and leverage to PIP- 10 reps Cont light rolling over foam roller - if no issues or pulling on incision in palm                 OT Education -  05/14/21 1409     Education Details progress and HEP    Person(s) Educated Patient    Methods Explanation;Demonstration;Tactile cues;Verbal cues;Handout    Comprehension Verbal cues required;Returned demonstration;Verbalized understanding              OT Short Term Goals - 04/30/21 1908       OT SHORT TERM GOAL #1   Title Exention of L digits improve to [facility] to be able to wash face and donn gloves    Baseline 2nd MC -35, 3rd -35, 4th -32 --= improved already from pre surgery measurement - incision open- in palm and 3rd digit    Time 4    Period Weeks    Status New    Target Date 05/28/21               OT Long Term Goals - 04/30/21 1911       OT LONG TERM GOAL #1   Title L hand scar tissue and edema decrease for pt to show increase AROM to make tight fist to grip utenciles    Baseline MC's flexion 80-85 and PIP 90 - open incision in palm - and 3rd digit - edema in all digits    Time 5    Period Weeks    Status New    Target Date 06/04/21      OT LONG TERM GOAL #2   Title L hand incision and scar improve for pt to grip objects and use hand in ADL's without increase tenderness or symptoms less than 2/10    Baseline incision still open in volar 3rd and palm - Zscar  in palm into volar 3rd - pain increase per pt - pain medication more than 5/10    Time 6    Period Weeks    Status New    Target Date 06/11/21      OT LONG TERM GOAL #3   Title L grip and prehension strength increase to more than 75% compare to R hand to grip tools and utenciles and return to prior level of function    Baseline open incisions in palm and volar 3rd - NT - 2 1/2 wks s/p  Time 6    Period Weeks    Status New    Target Date 06/11/21                   Plan - 05/14/21 1410     Clinical Impression Statement Pt present at OT 4 1/2 wks s/p Fasciectomy partial palmar with release of 1 digit with/without Z-plasty, left long finger Dupuytren's fasciectomy, and Fasciectomy palm only,  with/without Z-plasty, left index and ring finger palmar Dupuytren's fasciectomy. Pt made great progress in incision healing, only one scab on palm -and scars getting softer and allowing more extention and flexion in 3rd and 2nd digit- full extention on volar side- knuckles enlarge - pt fitted with scar pad to start using when scab comes off and siilicon sleeve on 3rd for scar and edema - flexion improving- pt to cont with same HEP nad night time dorsal extention splint - pt to follow upin in 2 wks - cont to need for cont OT services to do scar management , splinting to increase AROM and strength- pt limited in use of L hand in ADL's and IADL's    OT Occupational Profile and History Problem Focused Assessment - Including review of records relating to presenting problem    Occupational performance deficits (Please refer to evaluation for details): ADL's;IADL's;Leisure;Social Participation;Play    Body Structure / Function / Physical Skills ADL;Flexibility;Sensation;Scar mobility;ROM;Edema;Strength;Pain;UE functional use    Rehab Potential Good    Clinical Decision Making Limited treatment options, no task modification necessary    Comorbidities Affecting Occupational Performance: May have comorbidities impacting occupational performance    Modification or Assistance to Complete Evaluation  No modification of tasks or assist necessary to complete eval    OT Frequency 1x / week    OT Duration 8 weeks    OT Treatment/Interventions Self-care/ADL training;Paraffin;Moist Heat;Fluidtherapy;Contrast Bath;Ultrasound;Therapeutic exercise;Splinting;Patient/family education;Manual Therapy;Scar mobilization    Consulted and Agree with Plan of Care Patient             Patient will benefit from skilled therapeutic intervention in order to improve the following deficits and impairments:   Body Structure / Function / Physical Skills: ADL, Flexibility, Sensation, Scar mobility, ROM, Edema, Strength, Pain, UE  functional use       Visit Diagnosis: Dupuytren contracture  Muscle weakness (generalized)  Scar condition and fibrosis of skin  Stiffness of left hand, not elsewhere classified  Pain in left hand    Problem List Patient Active Problem List   Diagnosis Date Noted   Controlled type 2 diabetes mellitus with complication, without long-term current use of insulin ([place]) 04/27/2021   Moderate mixed hyperlipidemia not requiring statin therapy 04/27/2021   Encounter to establish care 12/29/2020   Primary insomnia 12/29/2020   Primary hypertension 12/29/2020    Rosalyn Gess OTR/L,CLT 05/14/2021, 2:14 PM  [facility] [address], [place], [address] Phone: [phone]   Fax:  [phone]  Name: Joseph Howell MRN: [id number] Date of Birth: Nov 12, 1946

## 2021-05-29 ENCOUNTER — Ambulatory Visit: Payer: Medicare HMO | Attending: Orthopedic Surgery | Admitting: Occupational Therapy

## 2021-05-29 DIAGNOSIS — M25642 Stiffness of left hand, not elsewhere classified: Secondary | ICD-10-CM | POA: Diagnosis not present

## 2021-05-29 DIAGNOSIS — M720 Palmar fascial fibromatosis [Dupuytren]: Secondary | ICD-10-CM | POA: Diagnosis not present

## 2021-05-29 DIAGNOSIS — M79642 Pain in left hand: Secondary | ICD-10-CM

## 2021-05-29 DIAGNOSIS — M6281 Muscle weakness (generalized): Secondary | ICD-10-CM | POA: Diagnosis not present

## 2021-05-29 DIAGNOSIS — L905 Scar conditions and fibrosis of skin: Secondary | ICD-10-CM | POA: Diagnosis not present

## 2021-05-29 NOTE — Therapy (Signed)
[facility] [address], [place], [address] Phone: [phone]   Fax:  [phone]  Occupational Therapy Treatment  Patient Details  Name: Joseph Howell MRN: [id number] Date of Birth: 06-25-1946 Referring Provider (OT): DR Draeger   Encounter Date: 05/29/2021   OT End of Session - 05/29/21 1126     Visit Number 4    Number of Visits 12    Date for OT Re-Evaluation 06/11/21    OT Start Time 1046    OT Stop Time 1110    OT Time Calculation (min) 24 min    Activity Tolerance Patient tolerated treatment well    Behavior During Therapy [facility] for tasks assessed/performed             Past Medical History:  Diagnosis Date   Allergy    Arthritis    Diabetes mellitus without complication ([place])    Hyperlipidemia    Hypertension    Liver disease, unspecified     Past Surgical History:  Procedure Laterality Date   [place]   C-5/6 & 6/7   CATARACT EXTRACTION  2004   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 12/21/2013   dupuytrens release R hand 02/06/21 Right    FOOT SURGERY Bilateral in the 90's   Plantar Fibromatosis   HAND SURGERY  [id number]    Multiple (Dupuytrens Contracture)   KNEE ARTHROSCOPY WITH MEDIAL MENISECTOMY Left 2002   PIP JOINT FUSION Right 07/16/2019   ROTATOR CUFF REPAIR Left 2003   TONSILLECTOMY AND ADENOIDECTOMY  1951   ULNAR TUNNEL RELEASE Left 03/03/2016   VASECTOMY  1986   XI ROBOTIC ASSISTED SIMPLE PROSTATECTOMY  02/19/2019    There were no vitals filed for this visit.   Subjective Assessment - 05/29/21 1125     Subjective  Doing very well -full motion - scar feels good- can feel here and there during day scar in palm wants to tighten up - then I stretch it out - and still wearing night time sleeve - using it more - doing really well    Pertinent History Joseph Howell is a 75 y.o. male with history of extensive bilateral Dupuytren's  contracture. He recently underwent Dupuytren's fasciectomy by myself on the contralateral upper extremity. He continues to be bothered by contractures primarily at his left long finger but also natatory cords extending to his index and ring fingers. After informing the patient of the benefits and risks of ongoing nonoperative management versus surgical Dupuytren's fasciectomy, she wished to proceed with surgery, Had 5/20 L hand done - stitches out today and refer to hand therapy and OT for splint and OT to increase ROM - and scar tissue    Patient Stated Goals Want to be able to use my hands again to grasp things, wash face, donn gloves and maybe work litlte on the side again    Currently in Pain? Yes    Pain Score 1     Pain Location Hand    Pain Orientation Left    Pain Descriptors / Indicators Tightness;Tender    Pain Type Surgical pain    Pain Onset More than a month ago    Pain Frequency Intermittent                [facility] OT Assessment - 05/29/21 0001       Strength   Right Hand Grip (lbs) 75    Right Hand Lateral Pinch 22 lbs  Right Hand 3 Point Pinch 18 lbs    Left Hand Grip (lbs) 62    Left Hand Lateral Pinch 20 lbs    Left Hand 3 Point Pinch 14 lbs      Right Hand AROM   R Ring  MCP 0-90 90 Degrees   -25   R Ring PIP 0-100 100 Degrees   0   R Little  MCP 0-90 90 Degrees   -20   R Little PIP 0-100 80 Degrees   0     Left Hand AROM   L Long  MCP 0-90 90 Degrees   0   L Long PIP 0-100 96 Degrees   0   L Ring  MCP 0-90 --   0               Pt arrive with great progress in scar healing  -and L scar tissue softer and increase extention and flexion of digits - fitted with cica scar pad  to cont to use for about 3-4 wks   Great progress in extention and flexion - see flow sheet Review  scar massage  and monitor for extention lag if stop wearing splint    Dorsal hand base extention splint fabricated for 2nd thru 4th after surgery for L - to use during night time-  straps comfortable -and mold skin inside over [facility] for padding Report no issues - cont to wear to 3-4 months s/p       Pt to do contrast and gentle AROM tendon glides  but pt to focus on intrinsic and pen in palm during composite flexion to block MC at 90 and leverage to PIP- 10 reps Cont light rolling over foam roller - if no issues               OT Education - 05/29/21 1126     Education Details progress and HEP    Person(s) Educated Patient    Methods Explanation;Demonstration;Tactile cues;Verbal cues;Handout    Comprehension Verbal cues required;Returned demonstration;Verbalized understanding              OT Short Term Goals - 05/29/21 1128       OT SHORT TERM GOAL #1   Title Exention of L digits improve to [facility] to be able to wash face and donn gloves    Status Achieved               OT Long Term Goals - 05/29/21 1128       OT LONG TERM GOAL #1   Title L hand scar tissue and edema decrease for pt to show increase AROM to make tight fist to grip utenciles    Status Achieved      OT LONG TERM GOAL #2   Title L hand incision and scar improve for pt to grip objects and use hand in ADL's without increase tenderness or symptoms less than 2/10    Baseline incision still open in volar 3rd and palm - Zscar  in palm into volar 3rd - pain increase per pt - pain medication more than 5/10- no pain tenderness about 1/10 - scar great- and motion WNL    Status Achieved      OT LONG TERM GOAL #3   Title L grip and prehension strength increase to more than 75% compare to R hand to grip tools and utenciles and return to prior level of function    Baseline Grip 65L, R 75 lbs - prehension -WNL  see flowsheet    Status Achieved                   Plan - 05/29/21 1127     Clinical Impression Statement Pt present at OT 6 1/2 wks s/p Fasciectomy partial palmar with release of 1 digit with/without Z-plasty, left long finger Dupuytren's fasciectomy, and Fasciectomy  palm only, with/without Z-plasty, left index and ring finger palmar Dupuytren's fasciectomy. Pt made great progress in incision healing,  scars getting softer and allowing more extention and flexion in 3rd and 2nd digit- full extention on volar side- knuckles enlarge - pt fitted with scar pad again to use for few more weeks - flexion improving- pt to cont with same HEP and night time dorsal extention splint - pt to follow upin in 5 wks if needed- otherwise cont monitor for extention lag    OT Occupational Profile and History Problem Focused Assessment - Including review of records relating to presenting problem    Occupational performance deficits (Please refer to evaluation for details): ADL's;IADL's;Leisure;Social Participation;Play    Body Structure / Function / Physical Skills ADL;Flexibility;Sensation;Scar mobility;ROM;Edema;Strength;Pain;UE functional use    Rehab Potential Good    Clinical Decision Making Limited treatment options, no task modification necessary    Comorbidities Affecting Occupational Performance: May have comorbidities impacting occupational performance    Modification or Assistance to Complete Evaluation  No modification of tasks or assist necessary to complete eval    OT Duration 4 weeks    OT Treatment/Interventions Self-care/ADL training;Paraffin;Moist Heat;Fluidtherapy;Contrast Bath;Ultrasound;Therapeutic exercise;Splinting;Patient/family education;Manual Therapy;Scar mobilization    Consulted and Agree with Plan of Care Patient             Patient will benefit from skilled therapeutic intervention in order to improve the following deficits and impairments:   Body Structure / Function / Physical Skills: ADL, Flexibility, Sensation, Scar mobility, ROM, Edema, Strength, Pain, UE functional use       Visit Diagnosis: Dupuytren contracture  Muscle weakness (generalized)  Scar condition and fibrosis of skin  Stiffness of left hand, not elsewhere  classified  Pain in left hand    Problem List Patient Active Problem List   Diagnosis Date Noted   Controlled type 2 diabetes mellitus with complication, without long-term current use of insulin ([place]) 04/27/2021   Moderate mixed hyperlipidemia not requiring statin therapy 04/27/2021   Encounter to establish care 12/29/2020   Primary insomnia 12/29/2020   Primary hypertension 12/29/2020    Rosalyn Gess OTR/L,CLT  05/29/2021, 11:36 AM  [facility] [address] [place], [address] Phone: [phone]   Fax:  [phone]  Name: Altin Sease MRN: [id number] Date of Birth: [birth year]

## 2021-07-27 ENCOUNTER — Ambulatory Visit: Payer: Medicare HMO | Admitting: Family Medicine

## 2021-08-01 ENCOUNTER — Ambulatory Visit: Payer: Medicare HMO | Admitting: Internal Medicine

## 2021-08-02 DIAGNOSIS — R000 Tachycardia, unspecified: Secondary | ICD-10-CM | POA: Diagnosis not present

## 2021-08-02 DIAGNOSIS — M545 Low back pain, unspecified: Secondary | ICD-10-CM | POA: Diagnosis not present

## 2021-08-02 DIAGNOSIS — I10 Essential (primary) hypertension: Secondary | ICD-10-CM | POA: Diagnosis not present

## 2021-08-02 DIAGNOSIS — E7800 Pure hypercholesterolemia, unspecified: Secondary | ICD-10-CM | POA: Diagnosis not present

## 2021-08-21 ENCOUNTER — Other Ambulatory Visit: Payer: Self-pay | Admitting: *Deleted

## 2021-08-21 MED ORDER — ZOLPIDEM TARTRATE 10 MG PO TABS: 10.0000 mg | ORAL_TABLET | Freq: Every evening | ORAL | 0 refills | Status: DC | PRN

## 2021-08-22 DIAGNOSIS — I10 Essential (primary) hypertension: Secondary | ICD-10-CM | POA: Diagnosis not present

## 2021-08-22 DIAGNOSIS — Z23 Encounter for immunization: Secondary | ICD-10-CM | POA: Diagnosis not present

## 2021-08-22 DIAGNOSIS — E1140 Type 2 diabetes mellitus with diabetic neuropathy, unspecified: Secondary | ICD-10-CM | POA: Diagnosis not present

## 2021-09-24 ENCOUNTER — Other Ambulatory Visit: Payer: Self-pay | Admitting: *Deleted

## 2021-09-24 MED ORDER — ONETOUCH DELICA PLUS LANCET33G MISC: 2 refills | Status: DC

## 2021-10-05 DIAGNOSIS — X32XXXA Exposure to sunlight, initial encounter: Secondary | ICD-10-CM | POA: Diagnosis not present

## 2021-10-05 DIAGNOSIS — L570 Actinic keratosis: Secondary | ICD-10-CM | POA: Diagnosis not present

## 2021-10-05 DIAGNOSIS — L718 Other rosacea: Secondary | ICD-10-CM | POA: Diagnosis not present

## 2021-11-01 DIAGNOSIS — E559 Vitamin D deficiency, unspecified: Secondary | ICD-10-CM | POA: Diagnosis not present

## 2021-11-01 DIAGNOSIS — I10 Essential (primary) hypertension: Secondary | ICD-10-CM | POA: Diagnosis not present

## 2021-11-01 DIAGNOSIS — E039 Hypothyroidism, unspecified: Secondary | ICD-10-CM | POA: Diagnosis not present

## 2021-11-01 DIAGNOSIS — R5381 Other malaise: Secondary | ICD-10-CM | POA: Diagnosis not present

## 2021-11-01 DIAGNOSIS — E1140 Type 2 diabetes mellitus with diabetic neuropathy, unspecified: Secondary | ICD-10-CM | POA: Diagnosis not present

## 2021-11-01 DIAGNOSIS — E669 Obesity, unspecified: Secondary | ICD-10-CM | POA: Diagnosis not present

## 2021-11-01 DIAGNOSIS — Z79899 Other long term (current) drug therapy: Secondary | ICD-10-CM | POA: Diagnosis not present

## 2021-11-01 DIAGNOSIS — R7989 Other specified abnormal findings of blood chemistry: Secondary | ICD-10-CM | POA: Diagnosis not present

## 2021-11-01 DIAGNOSIS — R000 Tachycardia, unspecified: Secondary | ICD-10-CM | POA: Diagnosis not present

## 2021-11-01 DIAGNOSIS — R002 Palpitations: Secondary | ICD-10-CM | POA: Diagnosis not present

## 2021-11-01 DIAGNOSIS — D519 Vitamin B12 deficiency anemia, unspecified: Secondary | ICD-10-CM | POA: Diagnosis not present

## 2021-11-01 DIAGNOSIS — N400 Enlarged prostate without lower urinary tract symptoms: Secondary | ICD-10-CM | POA: Diagnosis not present

## 2021-11-01 DIAGNOSIS — E119 Type 2 diabetes mellitus without complications: Secondary | ICD-10-CM | POA: Diagnosis not present

## 2021-11-01 DIAGNOSIS — E7800 Pure hypercholesterolemia, unspecified: Secondary | ICD-10-CM | POA: Diagnosis not present

## 2021-11-01 DIAGNOSIS — E118 Type 2 diabetes mellitus with unspecified complications: Secondary | ICD-10-CM | POA: Diagnosis not present

## 2021-12-19 ENCOUNTER — Other Ambulatory Visit: Payer: Self-pay | Admitting: Internal Medicine

## 2022-01-07 DIAGNOSIS — R946 Abnormal results of thyroid function studies: Secondary | ICD-10-CM | POA: Diagnosis not present

## 2022-01-31 DIAGNOSIS — E119 Type 2 diabetes mellitus without complications: Secondary | ICD-10-CM | POA: Diagnosis not present

## 2022-01-31 DIAGNOSIS — E559 Vitamin D deficiency, unspecified: Secondary | ICD-10-CM | POA: Diagnosis not present

## 2022-01-31 DIAGNOSIS — E039 Hypothyroidism, unspecified: Secondary | ICD-10-CM | POA: Diagnosis not present

## 2022-01-31 DIAGNOSIS — I10 Essential (primary) hypertension: Secondary | ICD-10-CM | POA: Diagnosis not present

## 2022-01-31 DIAGNOSIS — M461 Sacroiliitis, not elsewhere classified: Secondary | ICD-10-CM | POA: Diagnosis not present

## 2022-01-31 DIAGNOSIS — D519 Vitamin B12 deficiency anemia, unspecified: Secondary | ICD-10-CM | POA: Diagnosis not present

## 2022-01-31 DIAGNOSIS — E7800 Pure hypercholesterolemia, unspecified: Secondary | ICD-10-CM | POA: Diagnosis not present

## 2022-01-31 DIAGNOSIS — D649 Anemia, unspecified: Secondary | ICD-10-CM | POA: Diagnosis not present

## 2022-01-31 DIAGNOSIS — R000 Tachycardia, unspecified: Secondary | ICD-10-CM | POA: Diagnosis not present

## 2022-03-12 DIAGNOSIS — E7800 Pure hypercholesterolemia, unspecified: Secondary | ICD-10-CM | POA: Diagnosis not present

## 2022-03-12 DIAGNOSIS — E039 Hypothyroidism, unspecified: Secondary | ICD-10-CM | POA: Diagnosis not present

## 2022-03-12 DIAGNOSIS — E1140 Type 2 diabetes mellitus with diabetic neuropathy, unspecified: Secondary | ICD-10-CM | POA: Diagnosis not present

## 2022-03-12 DIAGNOSIS — I6523 Occlusion and stenosis of bilateral carotid arteries: Secondary | ICD-10-CM | POA: Diagnosis not present

## 2022-03-12 DIAGNOSIS — I10 Essential (primary) hypertension: Secondary | ICD-10-CM | POA: Diagnosis not present

## 2022-03-12 DIAGNOSIS — I517 Cardiomegaly: Secondary | ICD-10-CM | POA: Diagnosis not present

## 2022-03-19 DIAGNOSIS — Z981 Arthrodesis status: Secondary | ICD-10-CM | POA: Diagnosis not present

## 2022-03-19 DIAGNOSIS — M5416 Radiculopathy, lumbar region: Secondary | ICD-10-CM | POA: Diagnosis not present

## 2022-03-25 DIAGNOSIS — M533 Sacrococcygeal disorders, not elsewhere classified: Secondary | ICD-10-CM | POA: Diagnosis not present

## 2022-03-25 DIAGNOSIS — Z981 Arthrodesis status: Secondary | ICD-10-CM | POA: Diagnosis not present

## 2022-03-25 DIAGNOSIS — Z9889 Other specified postprocedural states: Secondary | ICD-10-CM | POA: Diagnosis not present

## 2022-03-28 DIAGNOSIS — E1140 Type 2 diabetes mellitus with diabetic neuropathy, unspecified: Secondary | ICD-10-CM | POA: Diagnosis not present

## 2022-03-28 DIAGNOSIS — E039 Hypothyroidism, unspecified: Secondary | ICD-10-CM | POA: Diagnosis not present

## 2022-03-28 DIAGNOSIS — E7800 Pure hypercholesterolemia, unspecified: Secondary | ICD-10-CM | POA: Diagnosis not present

## 2022-03-28 DIAGNOSIS — I10 Essential (primary) hypertension: Secondary | ICD-10-CM | POA: Diagnosis not present

## 2022-03-28 DIAGNOSIS — Z1159 Encounter for screening for other viral diseases: Secondary | ICD-10-CM | POA: Diagnosis not present

## 2022-04-03 DIAGNOSIS — D2262 Melanocytic nevi of left upper limb, including shoulder: Secondary | ICD-10-CM | POA: Diagnosis not present

## 2022-04-03 DIAGNOSIS — L821 Other seborrheic keratosis: Secondary | ICD-10-CM | POA: Diagnosis not present

## 2022-04-03 DIAGNOSIS — X32XXXA Exposure to sunlight, initial encounter: Secondary | ICD-10-CM | POA: Diagnosis not present

## 2022-04-03 DIAGNOSIS — D2272 Melanocytic nevi of left lower limb, including hip: Secondary | ICD-10-CM | POA: Diagnosis not present

## 2022-04-03 DIAGNOSIS — D225 Melanocytic nevi of trunk: Secondary | ICD-10-CM | POA: Diagnosis not present

## 2022-04-03 DIAGNOSIS — D2271 Melanocytic nevi of right lower limb, including hip: Secondary | ICD-10-CM | POA: Diagnosis not present

## 2022-04-03 DIAGNOSIS — D2261 Melanocytic nevi of right upper limb, including shoulder: Secondary | ICD-10-CM | POA: Diagnosis not present

## 2022-04-03 DIAGNOSIS — L718 Other rosacea: Secondary | ICD-10-CM | POA: Diagnosis not present

## 2022-04-03 DIAGNOSIS — L570 Actinic keratosis: Secondary | ICD-10-CM | POA: Diagnosis not present

## 2022-04-04 ENCOUNTER — Other Ambulatory Visit: Payer: Self-pay | Admitting: Neurosurgery

## 2022-04-04 DIAGNOSIS — M5136 Other intervertebral disc degeneration, lumbar region: Secondary | ICD-10-CM

## 2022-04-04 DIAGNOSIS — M5416 Radiculopathy, lumbar region: Secondary | ICD-10-CM

## 2022-04-13 ENCOUNTER — Ambulatory Visit
Admission: RE | Admit: 2022-04-13 | Discharge: 2022-04-13 | Disposition: A | Discharge status: Home / Self Care | Payer: Medicare HMO | Source: Ambulatory Visit | Attending: Neurosurgery | Admitting: Neurosurgery

## 2022-04-13 DIAGNOSIS — S32010A Wedge compression fracture of first lumbar vertebra, initial encounter for closed fracture: Secondary | ICD-10-CM | POA: Diagnosis not present

## 2022-04-13 DIAGNOSIS — M5416 Radiculopathy, lumbar region: Secondary | ICD-10-CM | POA: Insufficient documentation

## 2022-04-13 DIAGNOSIS — M5136 Other intervertebral disc degeneration, lumbar region: Secondary | ICD-10-CM | POA: Diagnosis not present

## 2022-04-19 ENCOUNTER — Other Ambulatory Visit: Payer: Self-pay | Admitting: Neurosurgery

## 2022-04-19 DIAGNOSIS — S32010A Wedge compression fracture of first lumbar vertebra, initial encounter for closed fracture: Secondary | ICD-10-CM

## 2022-04-25 ENCOUNTER — Telehealth: Payer: Self-pay | Admitting: Student

## 2022-04-25 ENCOUNTER — Telehealth: Payer: Self-pay | Admitting: Internal Medicine

## 2022-04-25 ENCOUNTER — Other Ambulatory Visit: Payer: Self-pay | Admitting: *Deleted

## 2022-04-25 ENCOUNTER — Other Ambulatory Visit: Payer: Self-pay | Admitting: Neurosurgery

## 2022-04-25 ENCOUNTER — Ambulatory Visit
Admission: RE | Admit: 2022-04-25 | Discharge: 2022-04-25 | Disposition: A | Discharge status: Home / Self Care | Payer: Medicare HMO | Source: Ambulatory Visit | Attending: Neurosurgery | Admitting: Neurosurgery

## 2022-04-25 DIAGNOSIS — S32020A Wedge compression fracture of second lumbar vertebra, initial encounter for closed fracture: Secondary | ICD-10-CM | POA: Diagnosis not present

## 2022-04-25 DIAGNOSIS — S32010A Wedge compression fracture of first lumbar vertebra, initial encounter for closed fracture: Secondary | ICD-10-CM | POA: Diagnosis not present

## 2022-04-25 DIAGNOSIS — M533 Sacrococcygeal disorders, not elsewhere classified: Secondary | ICD-10-CM | POA: Diagnosis not present

## 2022-04-25 DIAGNOSIS — S32000A Wedge compression fracture of unspecified lumbar vertebra, initial encounter for closed fracture: Secondary | ICD-10-CM

## 2022-04-25 MED ORDER — HYDROCODONE-ACETAMINOPHEN 10-325 MG PO TABS: 1.0000 | ORAL_TABLET | Freq: Four times a day (QID) | ORAL | 0 refills | Status: AC | PRN

## 2022-04-25 NOTE — Progress Notes (Signed)
Request received from Roselyn Reef, NP from Dr. Laurence Ferrari to send e-prescription for pt for Norco 10/325 q 6 hrs PRN #[id number]. Rx was sent to [facility] pharmacy on file and electronic receipt confirmed.      Narda Rutherford, AGNP-BC 04/25/2022, 12:59 PM

## 2022-04-25 NOTE — Consult Note (Signed)
Chief Complaint: Patient was seen in consultation today for lumbar spine and right SI joint pain at the request of Loleta Dicker  Referring Physician(s): Loleta Dicker  History of Present Illness: Joseph Howell is a 76 y.o. male who comes in to see me today with complaints of a subacute L1 compression fracture and significant lumbar spine pain.  I reviewed his images earlier today and identified that he not only has a fracture of the superior endplate of L1, but also a fracture of the superior endplate of L2 on the left side.  I reviewed this with my neuroradiology partners who have appropriately addended his MRI report to include this new finding.  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  Unfortunately, early in May near Mother's Day he had a significant episode of vertigo which resulted in a fall.  He fell with his whole body weight onto his backside more on the left than the right.  In the days following this procedure he began developing significant lumbar spine pain which has been progressive ever since.  His pain is more significant when he is lying in bed and changing positions, or having to get up out of bed.  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  He has had prior sacroiliac joint injections, radiofrequency nerve root ablations and ultimately a surgical arthrodesis.  His pain was relatively well managed until the fall 2021 when he moved from [place] to [place].  During the move he had to pack over 100 boxes and caused an exacerbation in his right SI joint symptoms.  He was managing this fairly well until he suffered this recent back fracture.  Now, the combination of pain from the lumbar compression fractures and the right sacroiliac joint is quite severe and debilitating.  In fact, he scored a 16 out of 24 on the [employer]  disability questionnaire.  On his best days, his pain rates between a 3 and a 4, however on worst days his pain rates between a 6 and an 8.  On those days, he has to take pain medication.  He had some Norco left over from his prior surgery but has since run out.  He does not find anti-inflammatory medications to be helpful.  He has tried Aleve with 0 benefit.  He does get some relief from 1000 mg of Tylenol which he tries not to take more than once per day.  His symptoms caused him to walk with a limp intermittently.  He denies lower extremity weakness, paresthesias or other symptoms of myelopathy.  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  Medications: Prior to Admission medications   Medication Sig Start Date End Date Taking? Authorizing Provider  gabapentin (NEURONTIN) 100 MG capsule Take 100 mg by mouth 3 (three) times daily.   Yes [provider]  levothyroxine (SYNTHROID) 25 MCG tablet Take 25 mcg by mouth daily before breakfast.   Yes [provider]  acetaminophen-codeine (TYLENOL #3) 300-30 MG tablet Take by mouth. 04/18/22 04/25/22  [provider]  acetaminophen-codeine (TYLENOL #3) 300-30 MG tablet Take 1 tablet by mouth every 6 (six) hours as needed. 04/18/22   [provider]  atorvastatin (LIPITOR) 10 MG tablet Take 1 tablet (10 mg total) by mouth at bedtime. 03/27/21   Cletis Athens, MD  cyclobenzaprine (FLEXERIL) 10 MG tablet Take 1 tablet (10 mg total) by mouth 3 (three) times daily as needed for muscle spasms. 12/29/20   Beckie Salts, FNP  glucose blood (ONETOUCH VERIO) test strip CHECK BLOOD SUGAR ONCE DAILY BEFORE BREAKFAST 12/19/21   Cletis Athens, MD  Lancets (ONETOUCH DELICA PLUS [id number]) MISC CHECK ONE TIME DAILY BEFORE BREAKFAST 12/19/21   Cletis Athens, MD  lisinopril (ZESTRIL) 20 MG tablet Take 1 tablet (20 mg total) by mouth daily. 03/27/21   Cletis Athens, MD  metFORMIN (GLUCOPHAGE) 500 MG tablet Take 1 tablet (500 mg total) by mouth daily. 03/27/21   Cletis Athens, MD  zolpidem (AMBIEN) 10 MG tablet Take 1 tablet (10 mg total) by mouth at bedtime as needed. 08/21/21   Cletis Athens, MD     Family History  Adopted: Yes  Family history unknown: Yes    Social History   Socioeconomic History   Marital status: Divorced    Spouse name: Not on file   Number of children: Not on file   Years of education: Not on file   Highest education level: Not on file  Occupational History   Not on file  Tobacco Use   Smoking status: Never   Smokeless tobacco: Never  Vaping Use   Vaping Use: Never used  Substance and Sexual Activity   Alcohol use: Yes    Comment: 2 daily   Drug use: Never   Sexual activity: Not Currently  Other Topics Concern   Not on file  Social History Narrative   Not on file   Social Determinants of Health   Financial Resource Strain: Not on file  Food Insecurity: Not on file  Transportation Needs: Not on file  Physical Activity: Not on file  Stress: Not on file  Social Connections: Not on file     Review of Systems: A 12 point ROS discussed and pertinent positives are  indicated in the HPI above.  All other systems are negative.  Review of Systems  Vital Signs: BP (!) 192/84 (BP Location: Right Arm, Patient Position: Sitting, Cuff Size: Normal)   Pulse (!) 112   Temp 98.1 F (36.7 C) (Oral)   Resp 16   Wt 93.9 kg   SpO2 98%   BMI 25.87 kg/m   Physical Exam Constitutional:      General: He is not in acute distress.    Appearance: Normal appearance.  HENT:     Head: Normocephalic and atraumatic.  Eyes:     General: No scleral icterus. Cardiovascular:     Rate and Rhythm: Normal rate.  Pulmonary:     Effort: Pulmonary effort is  normal.  Musculoskeletal:       Back:     Comments: Mild reproducible TTP at the L1 and L2 spinous processes.  Skin:    General: Skin is warm and dry.  Neurological:     Mental Status: He is alert and oriented to person, place, and time.  Psychiatric:        Mood and Affect: Mood normal.        Behavior: Behavior normal.      Imaging: MR LUMBAR SPINE WO CONTRAST  Addendum Date: 04/25/2022   ADDENDUM REPORT: 04/25/2022 08:30 ADDENDUM: In addition to the L1 superior endplate fracture, an acute superior endplate fracture is present at L2, only on the left. 20 percent loss of height is present on the left at L2. No underlying lesion is evident. No retropulsed bone is associated. An inferior endplate Schmorl's node at L2 has some reactive change. Electronically Signed   By: San Morelle M.D.   On: 04/25/2022 08:30   Result Date: 04/25/2022 CLINICAL DATA:  Chronic low back pain. EXAM: MRI LUMBAR SPINE WITHOUT CONTRAST TECHNIQUE: Multiplanar, multisequence MR imaging of the lumbar spine was performed. No intravenous contrast was administered. COMPARISON:  None Available. FINDINGS: Segmentation:  Standard. Alignment:  Physiologic. Vertebrae: Acute L1 vertebral body compression fracture with approximately 50% anterior height loss and marrow edema throughout the vertebral body. No retropulsion. No other acute  fracture. No discitis or osteomyelitis. No aggressive osseous lesion. Conus medullaris and cauda equina: Conus extends to the L1-2 level. Conus and cauda equina appear normal. Paraspinal and other soft tissues: No acute paraspinal abnormality. Disc levels: Disc spaces: Degenerative disease with disc height loss at L1-2. Disc desiccation at L3-4 and L4-5. T12-L1: No significant disc bulge. No neural foraminal stenosis. No central canal stenosis. L1-L2: Mild broad-based disc bulge. Mild bilateral facet arthropathy. No foraminal or central canal stenosis. L2-L3: Mild broad-based disc bulge with a central annular fissure. Mild bilateral facet arthropathy. Mild spinal stenosis. Bilateral lateral recess stenosis. No foraminal stenosis. L3-L4: No significant disc bulge. No neural foraminal stenosis. No central canal stenosis. Mild bilateral facet arthropathy. L4-L5: Mild broad-based disc bulge. Moderate bilateral facet arthropathy with small bilateral facet effusions. No foraminal or spinal stenosis. L5-S1: No significant disc bulge. No neural foraminal stenosis. No central canal stenosis. IMPRESSION: 1. Acute L1 vertebral body compression fracture with approximately 50% anterior height loss. 2. At L2-3 there is a mild broad-based disc bulge with a central annular fissure. Mild bilateral facet arthropathy. Mild spinal stenosis. Bilateral lateral recess stenosis. 3. At L4-5 there is a mild broad-based disc bulge. Moderate bilateral facet arthropathy with small bilateral facet effusions. Electronically Signed: By: Kathreen Devoid M.D. On: 04/15/2022 07:28    Labs:  CBC: Recent Labs    04/27/21 1014  WBC 9.0  HGB 13.3  HCT 39.8  PLT 274    COAGS: No results for input(s): INR, APTT in the last 8760 hours.  BMP: Recent Labs    04/27/21 1014  NA 140  K 4.2  CL 104  CO2 24  GLUCOSE 127*  BUN 8  CALCIUM 9.0  CREATININE 1.14  GFRNONAA 64  GFRAA 74    LIVER FUNCTION TESTS: Recent Labs     04/27/21 1014  BILITOT 0.7  AST 59*  ALT 45  PROT 6.0*    TUMOR MARKERS: No results for input(s): AFPTM, CEA, CA199, CHROMGRNA in the last 8760 hours.  Assessment and Plan:  Very pleasant 76 year old gentleman with a subacute L1 and L2  fractures as well as chronic longstanding right sacroiliac joint issues.  His pain is quite significant.  On his worst days, his pain rates a 6 or 7 out of 10 on a 10 point scale.  His symptoms are due to a combination of pain related to the lumbar fractures as well as exacerbation of his chronic right sacroiliac joint pain.  His pain was better managed previously with Norco, but he has no more.  He is waiting on a follow-up appointment with [facility] but does not yet have a date.  He is a candidate for cement augmentation with balloon kyphoplasty at both L1 and L2.  Additionally, I think he may benefit from a left sacroiliac joint injection with anesthetic and steroid.  This may provide some transient relief until he can get into see [facility].  Additionally, I will prescribe him a short course of hydrocodone/acetaminophen to help with his symptoms until we can get him treated.  1.)  Hydrocodone/acetaminophen 10/325 dispense #15. 2.)  Schedule for L1 and L2 cement augmentation with kyphoplasty and concurrent right sacroiliac joint injection.  He would be an excellent candidate for outpatient intervention at [facility].  Thank you for this interesting consult.  I greatly enjoyed meeting Joseph Howell and look forward to participating in their care.  A copy of this report was sent to the requesting provider on this date.  Electronically Signed: Criselda Peaches 04/25/2022, 12:17 PM   I spent a total of  60 Minutes  in face to face in clinical consultation, greater than 50% of which was counseling/coordinating care for L1 and L2 compression fractures, back pain and right SI joint pain.

## 2022-04-25 NOTE — Telephone Encounter (Signed)
Spoke with patient who confirmed his pharmacy is [facility] in [place].  Soyla Dryer, [place] [phone] 04/25/2022, 12:57 PM

## 2022-04-26 ENCOUNTER — Other Ambulatory Visit: Payer: Self-pay | Admitting: Family Medicine

## 2022-04-26 DIAGNOSIS — S32000A Wedge compression fracture of unspecified lumbar vertebra, initial encounter for closed fracture: Secondary | ICD-10-CM | POA: Diagnosis not present

## 2022-05-03 DIAGNOSIS — E7800 Pure hypercholesterolemia, unspecified: Secondary | ICD-10-CM | POA: Diagnosis not present

## 2022-05-03 DIAGNOSIS — D649 Anemia, unspecified: Secondary | ICD-10-CM | POA: Diagnosis not present

## 2022-05-03 DIAGNOSIS — E039 Hypothyroidism, unspecified: Secondary | ICD-10-CM | POA: Diagnosis not present

## 2022-05-03 DIAGNOSIS — I10 Essential (primary) hypertension: Secondary | ICD-10-CM | POA: Diagnosis not present

## 2022-05-03 DIAGNOSIS — E119 Type 2 diabetes mellitus without complications: Secondary | ICD-10-CM | POA: Diagnosis not present

## 2022-05-03 DIAGNOSIS — D519 Vitamin B12 deficiency anemia, unspecified: Secondary | ICD-10-CM | POA: Diagnosis not present

## 2022-05-03 DIAGNOSIS — E559 Vitamin D deficiency, unspecified: Secondary | ICD-10-CM | POA: Diagnosis not present

## 2022-05-03 DIAGNOSIS — E1140 Type 2 diabetes mellitus with diabetic neuropathy, unspecified: Secondary | ICD-10-CM | POA: Diagnosis not present

## 2022-05-06 NOTE — Discharge Instructions (Signed)
Kyphoplasty Post Procedure Discharge Instructions  May resume a regular diet and any medications that you routinely take (including pain medications). However, if you are taking Aspirin or an anticoagulant/blood thinner you will be told when you can resume taking these by the healthcare provider. No driving day of procedure. The day of your procedure take it easy. You may use an ice pack as needed to injection sites on back.  Ice to back 30 minutes on and 30 minutes off, as needed. May remove bandaids tomorrow after taking a shower. Replace daily with a clean bandaid until healed.  Do not lift anything heavier than a milk jug for 1-2 weeks or determined by your physician.  Follow up with your physician in 2 weeks.    Please contact our office at [phone] for the following symptoms or if you have any questions:  Fever greater than 100 degrees Increased swelling, pain, or redness at injection site. Increased back and/or leg pain New numbness or change in symptoms from before the procedure.    Thank you for visiting [facility]. Post Procedure Spinal Discharge Instruction Sheet  You may resume a regular diet and any medications that you routinely take (including pain medications) unless otherwise noted by MD.  No driving day of procedure.  Light activity throughout the rest of the day.  Do not do any strenuous work, exercise, bending or lifting.  The day following the procedure, you can resume normal physical activity but you should refrain from exercising or physical therapy for at least three days thereafter.  You may apply ice to the injection site, 20 minutes on, 20 minutes off, as needed. Do not apply ice directly to skin.    Common Side Effects:  Headaches- take your usual medications as directed by your physician.  Increase your fluid intake.  Caffeinated beverages may be helpful.  Lie flat in bed until your headache resolves.  Restlessness or inability to sleep- you  may have trouble sleeping for the next few days.  Ask your referring physician if you need any medication for sleep.  Facial flushing or redness- should subside within a few days.  Increased pain- a temporary increase in pain a day or two following your procedure is not unusual.  Take your pain medication as prescribed by your referring physician.  Leg cramps  Please contact our office at [phone] for the following symptoms: Fever greater than 100 degrees. Headaches unresolved with medication after 2-3 days. Increased swelling, pain, or redness at injection site.   Thank you for visiting [facility] today.

## 2022-05-07 ENCOUNTER — Ambulatory Visit
Admission: RE | Admit: 2022-05-07 | Discharge: 2022-05-07 | Disposition: A | Discharge status: Home / Self Care | Payer: Medicare HMO | Source: Ambulatory Visit | Attending: Neurosurgery | Admitting: Neurosurgery

## 2022-05-07 DIAGNOSIS — M8008XG Age-related osteoporosis with current pathological fracture, vertebra(e), subsequent encounter for fracture with delayed healing: Secondary | ICD-10-CM | POA: Diagnosis not present

## 2022-05-07 DIAGNOSIS — S32010A Wedge compression fracture of first lumbar vertebra, initial encounter for closed fracture: Secondary | ICD-10-CM

## 2022-05-07 DIAGNOSIS — M533 Sacrococcygeal disorders, not elsewhere classified: Secondary | ICD-10-CM | POA: Diagnosis not present

## 2022-05-07 HISTORY — PX: IR KYPHO LUMBAR INC FX REDUCE BONE BX UNI/BIL CANNULATION INC/IMAGING: IMG5519

## 2022-05-07 HISTORY — PX: IR KYPHO EA ADDL LEVEL THORACIC OR LUMBAR: IMG5520

## 2022-05-07 IMAGING — XA IR KYPHO VERTEBRAL ADDL AGMENTATION
1 series · 1 of 1 positions shown · non-contrast
Comparison: MRI lumbar spine 04/13/2022

CLINICAL DATA: 75-year-old gentleman with age related osteoporosis
and current pathologic fracture of the L1 and L2 vertebral bodies;
subsequent encounter for fracture with delayed healing. [id number].
Additionally, patient has chronic right sacroiliac joint dysfunction
with recurrent pain.

EXAM:
FLUOROSCOPIC GUIDED KYPHOPLASTY OF THE L1 VERTEBRAL BODY
FLUOROSCOPIC GUIDED KYPHOPLASTY OF THE L2 VERTEBRAL BODY
FLUOROSCOPIC GUIDED RIGHT SACROILIAC JOINT INJECTION

[Series 8: standard · 1 of 1 slices shown]
[im 1/1]
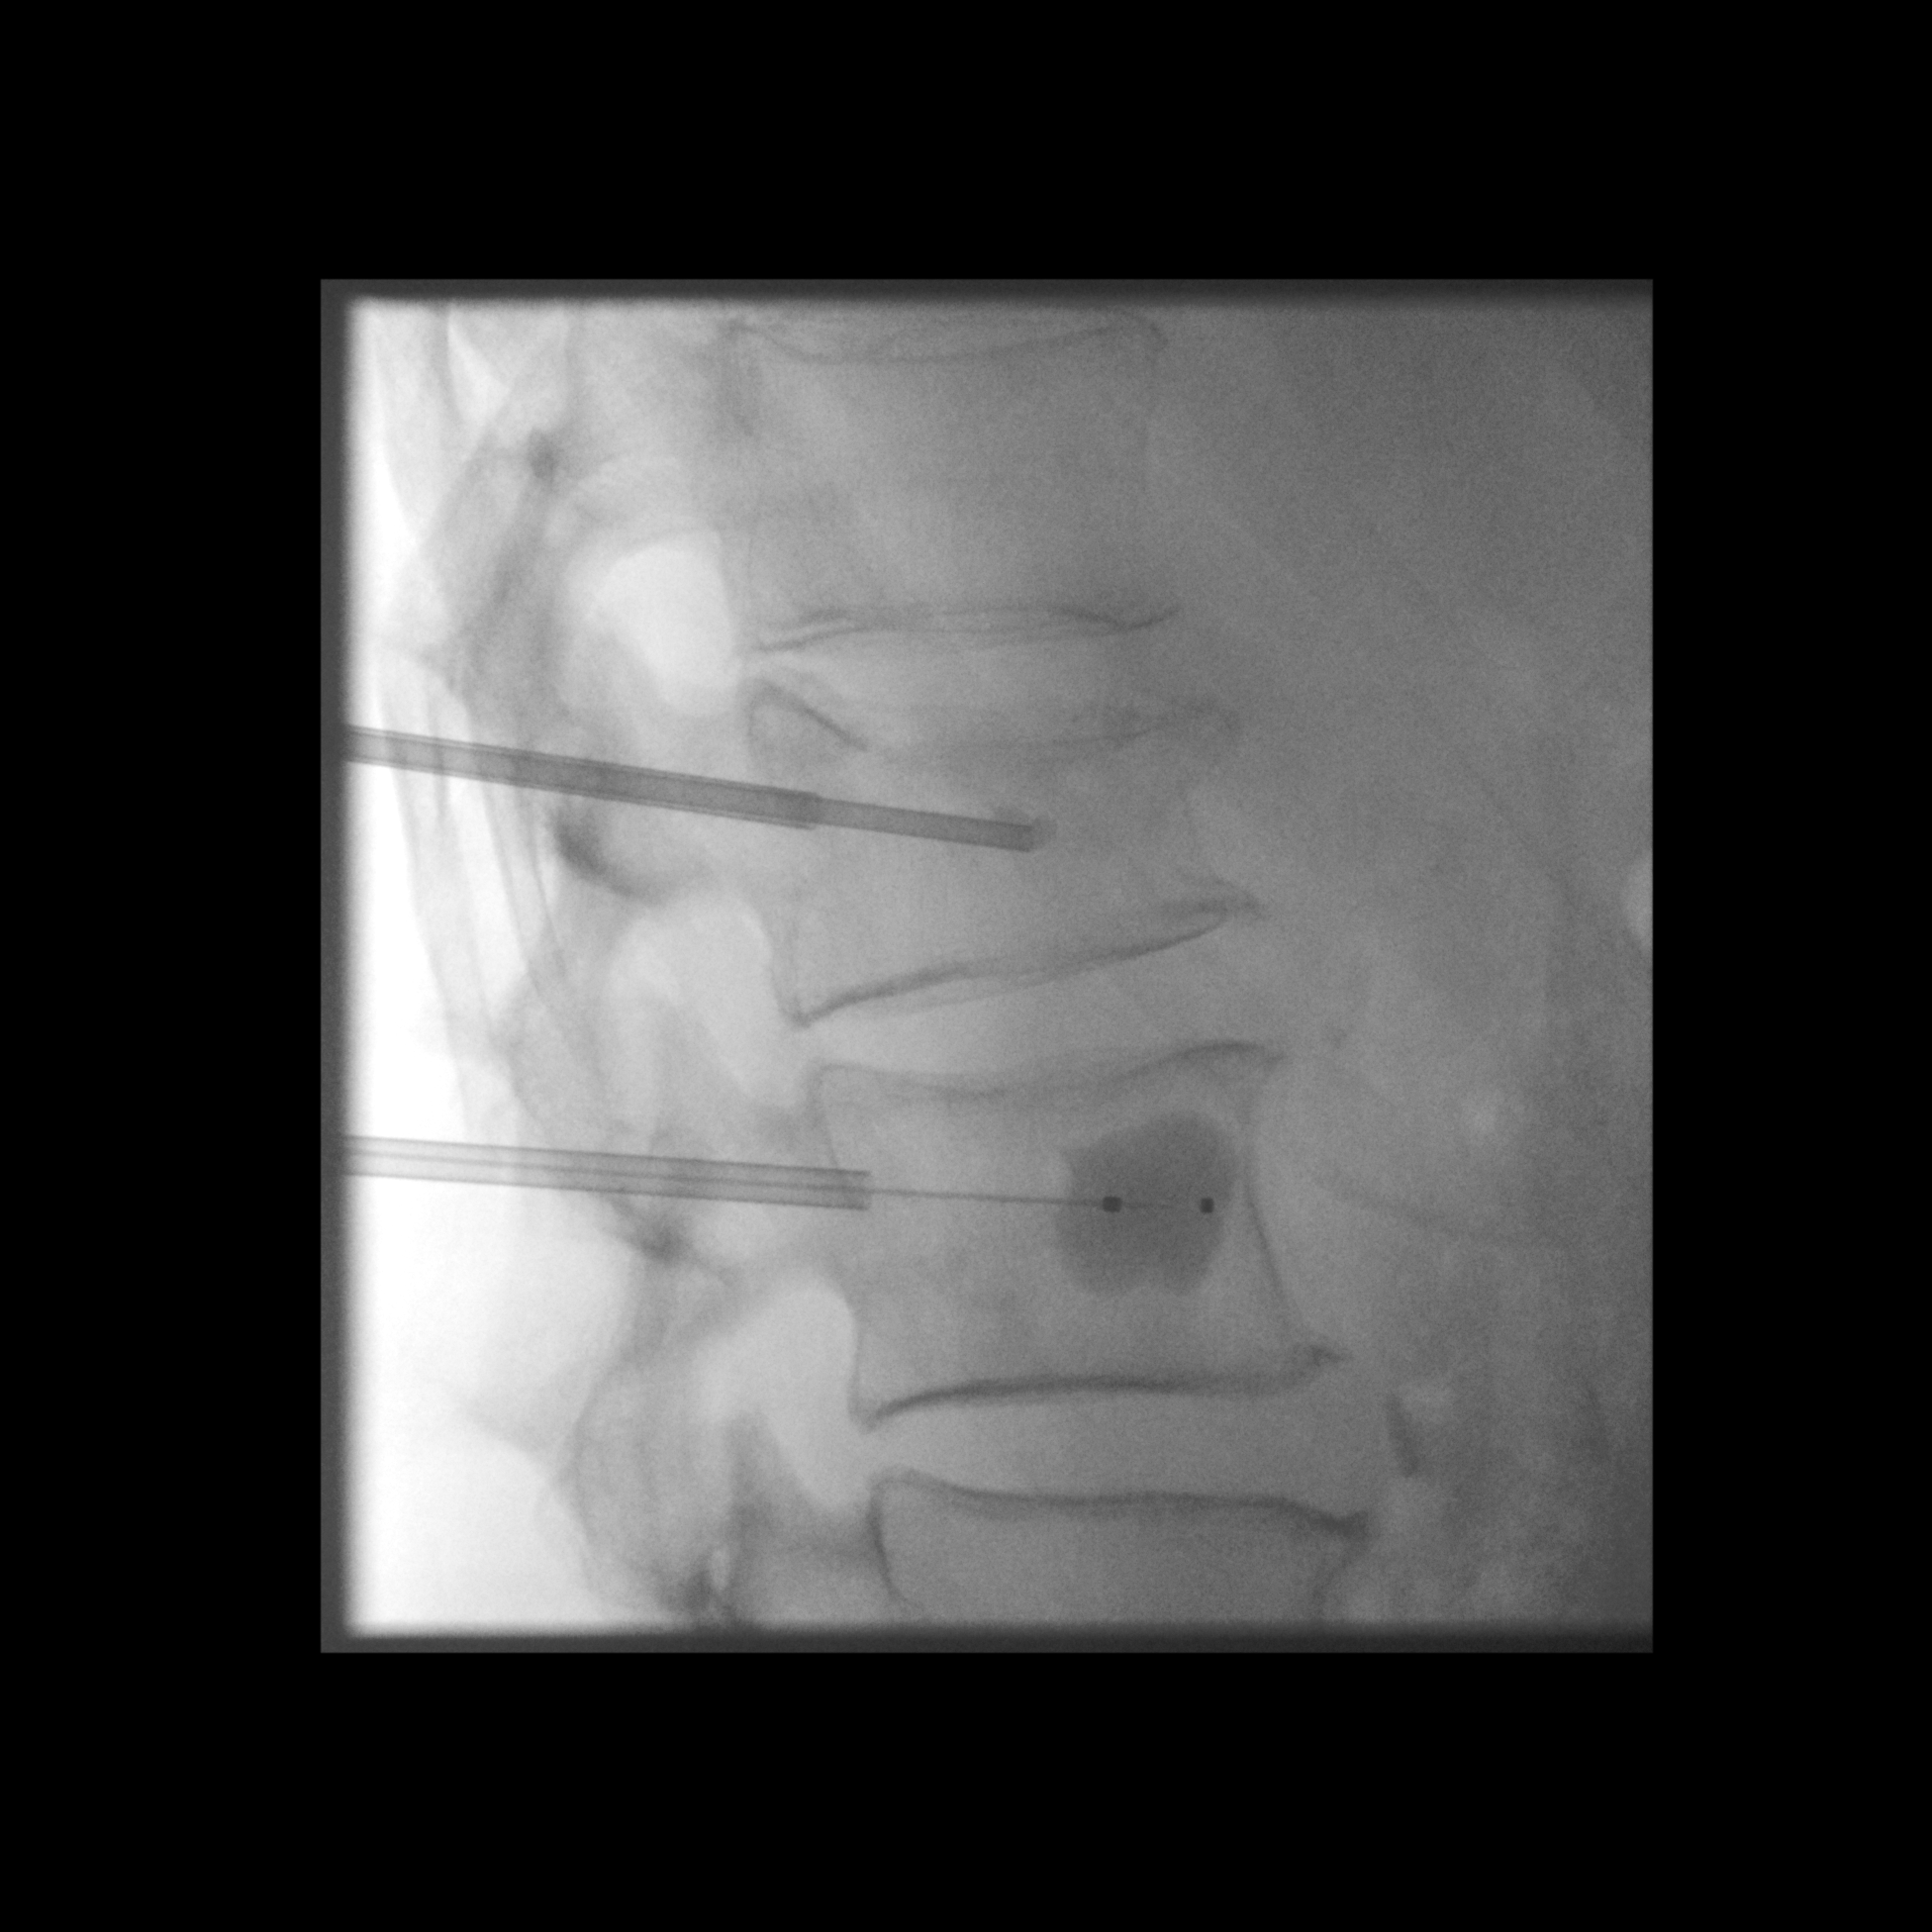

[1 of 1 positions shown; findings below may reference images not displayed]

MEDICATIONS:
As antibiotic prophylaxis, 2 g Ancef was ordered pre-procedure and
administered intravenously within 1 hour of incision.

ANESTHESIA/SEDATION:
Moderate (conscious) sedation was employed during this procedure. A
total of Versed 3 mg and Fentanyl 150 mcg was administered
intravenously.

Moderate Sedation Time: 47 minutes. The patient's level of
consciousness and vital signs were monitored continuously by
radiology nursing throughout the procedure under my direct
supervision.

FLUOROSCOPY TIME:  Radiation exposure index 71.4 mGy reference air
kerma

COMPLICATIONS:
None immediate.

PROCEDURE:
The procedure, risks (including but not limited to bleeding,
infection, organ damage), benefits, and alternatives were explained
to the patient. Questions regarding the procedure were encouraged
and answered. The patient understands and consents to the procedure.

The patient has suffered a fracture of the L1 and L2 vertebral
bodies. It is recommended that patients aged 50 years or older be
evaluated for possible testing or treatment of osteoporosis.

RIGHT SI INJECTION

Attention was first turned to the left sacroiliac joint. Using
fluoroscopy, the inferior aspect of the sacroiliac joint was
identified. The overlying skin was sterilely prepped and draped in
the standard fashion using Betadine skin prep. Local anesthesia was
attained by infiltration with 1% lidocaine. A 22 gauge curved spinal
needle was carefully advanced under direct fluoroscopy into the
joint. Confirmation the needle was into the joint was confirmed by
injection of a small amount of contrast material under imaging
guidance. 80 mg of Depo-Medrol and 1 mL 0.5% bupivacaine were then
injected into the joint. The needle was removed.

L1 AND L2 ABIMAEL

The patient was placed prone on the fluoroscopic table. The skin
overlying the upper thoracic region was then prepped and draped in
the usual sterile fashion. Maximal barrier sterile technique was
utilized including caps, mask, sterile gowns, sterile gloves,
sterile drape, hand hygiene and skin antiseptic. Intravenous
Fentanyl and Versed were administered as conscious sedation during
continuous cardiorespiratory monitoring by the radiology RN.

The right pedicle at L1 was then infiltrated with 1% lidocaine
followed by the advancement of a Kyphon trocar needle through the
left pedicle into the posterior one-third of the vertebral body.
Subsequently, the osteo drill was advanced to the anterior third of
the vertebral body. The osteo drill was retracted. Through the
working cannula, a Kyphon inflatable bone tamp 15 x 2.5 was advanced
and positioned with the distal marker approximately 5 mm from the
anterior aspect of the cortex. Appropriate positioning was confirmed
on the AP projection. At this time, the balloon was expanded using
contrast via a Kyphon inflation syringe device via micro tubing.

The left pedicle at L2 was then infiltrated with 1% lidocaine
followed by the advancement of a Kyphon trocar needle through the
left pedicle into the posterior one-third of the vertebral body.
Subsequently, the osteo drill was advanced to the anterior third of
the vertebral body. The osteo drill was retracted. Through the
working cannula, a Kyphon inflatable bone tamp 15 x 2.5 was advanced
and positioned with the distal marker approximately 5 mm from the
anterior aspect of the cortex. Appropriate positioning was confirmed
on the AP projection. At this time, the balloon was expanded using
contrast via a Kyphon inflation syringe device via micro tubing.

Inflations were continued until there was near apposition with the
superior end plate. At this time, methylmethacrylate mixture was
reconstituted in the Kyphon bone mixing device system. This was then
loaded into the delivery mechanism, attached to the cement delivery
system (ANOOP).

The balloons were deflated and removed followed by the instillation
of methylmethacrylate mixture with excellent filling in the AP and
lateral projections. No extravasation was noted in the disk spaces
or posteriorly into the spinal canal. No epidural venous
contamination was seen. Excellent filling across the entirety of the
superior endplate of L1. Excellent preferential filling on the left
the site of fracture at L2.

The working cannulae and the bone filler were then retrieved and
removed. Hemostasis was achieved with manual compression. The
patient tolerated the procedure well without immediate
postprocedural complication.
IMPRESSION: 1. Technically successful L1 vertebral body augmentation using
balloon kyphoplasty.
2. Technically successful L2 vertebral body augmentation using
balloon kyphoplasty.
3. Technically successful right sacroiliac joint injection of
steroid and local anesthetic.
4. Per CMS PQRS reporting requirements (PQRS Measure 24): Given the
patient's age of greater than 50 and the fracture site (hip, distal
radius, or spine), the patient should be tested for osteoporosis
using DXA, and the appropriate treatment considered based on the DXA
results.

## 2022-05-07 IMAGING — XA IR KYPHO VERTEBRAL LUMBAR AUGMENTATION
1 series · 1 of 1 positions shown · non-contrast
Comparison: MRI lumbar spine 04/13/2022

CLINICAL DATA: 75-year-old gentleman with age related osteoporosis
and current pathologic fracture of the L1 and L2 vertebral bodies;
subsequent encounter for fracture with delayed healing. [id number].
Additionally, patient has chronic right sacroiliac joint dysfunction
with recurrent pain.

EXAM:
FLUOROSCOPIC GUIDED KYPHOPLASTY OF THE L1 VERTEBRAL BODY
FLUOROSCOPIC GUIDED KYPHOPLASTY OF THE L2 VERTEBRAL BODY
FLUOROSCOPIC GUIDED RIGHT SACROILIAC JOINT INJECTION

[Series 2: standard · 1 of 1 slices shown]
[im 1/1]
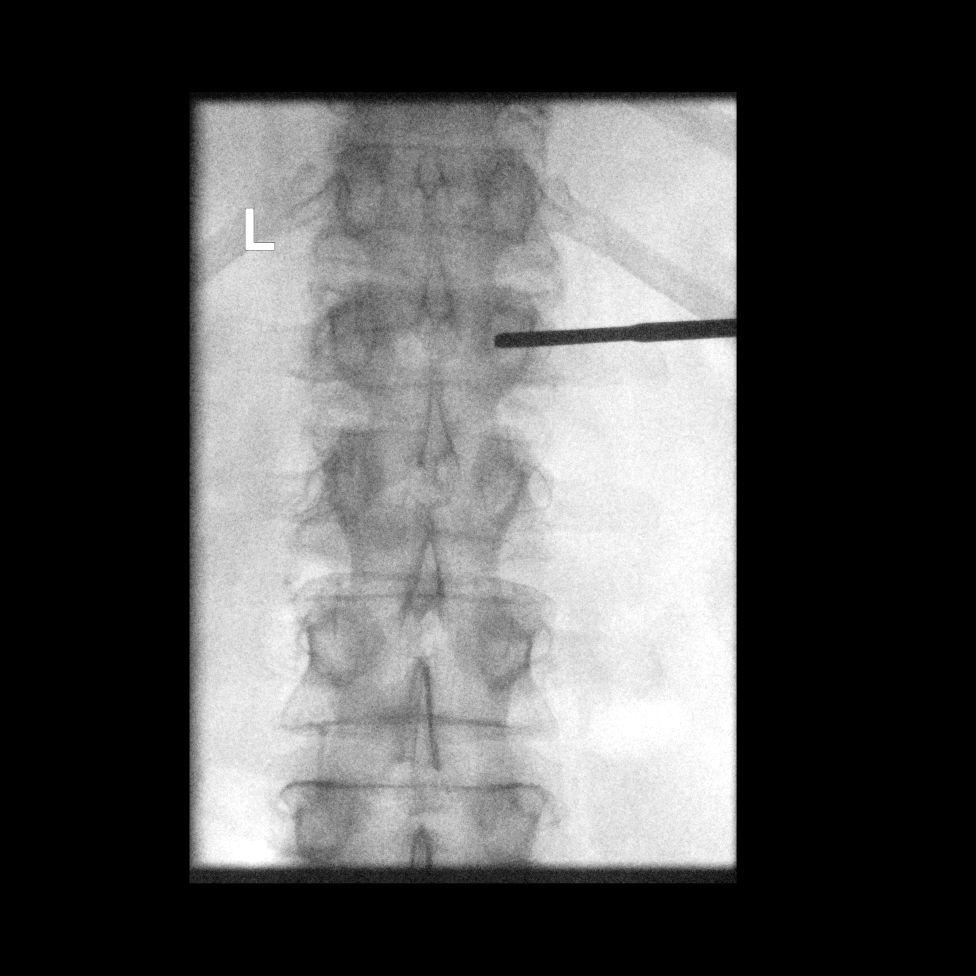

[1 of 1 positions shown; findings below may reference images not displayed]

MEDICATIONS:
As antibiotic prophylaxis, 2 g Ancef was ordered pre-procedure and
administered intravenously within 1 hour of incision.

ANESTHESIA/SEDATION:
Moderate (conscious) sedation was employed during this procedure. A
total of Versed 3 mg and Fentanyl 150 mcg was administered
intravenously.

Moderate Sedation Time: 47 minutes. The patient's level of
consciousness and vital signs were monitored continuously by
radiology nursing throughout the procedure under my direct
supervision.

FLUOROSCOPY TIME:  Radiation exposure index 71.4 mGy reference air
kerma

COMPLICATIONS:
None immediate.

PROCEDURE:
The procedure, risks (including but not limited to bleeding,
infection, organ damage), benefits, and alternatives were explained
to the patient. Questions regarding the procedure were encouraged
and answered. The patient understands and consents to the procedure.

The patient has suffered a fracture of the L1 and L2 vertebral
bodies. It is recommended that patients aged 50 years or older be
evaluated for possible testing or treatment of osteoporosis.

RIGHT SI INJECTION

Attention was first turned to the left sacroiliac joint. Using
fluoroscopy, the inferior aspect of the sacroiliac joint was
identified. The overlying skin was sterilely prepped and draped in
the standard fashion using Betadine skin prep. Local anesthesia was
attained by infiltration with 1% lidocaine. A 22 gauge curved spinal
needle was carefully advanced under direct fluoroscopy into the
joint. Confirmation the needle was into the joint was confirmed by
injection of a small amount of contrast material under imaging
guidance. 80 mg of Depo-Medrol and 1 mL 0.5% bupivacaine were then
injected into the joint. The needle was removed.

L1 AND L2 ABIMAEL

The patient was placed prone on the fluoroscopic table. The skin
overlying the upper thoracic region was then prepped and draped in
the usual sterile fashion. Maximal barrier sterile technique was
utilized including caps, mask, sterile gowns, sterile gloves,
sterile drape, hand hygiene and skin antiseptic. Intravenous
Fentanyl and Versed were administered as conscious sedation during
continuous cardiorespiratory monitoring by the radiology RN.

The right pedicle at L1 was then infiltrated with 1% lidocaine
followed by the advancement of a Kyphon trocar needle through the
left pedicle into the posterior one-third of the vertebral body.
Subsequently, the osteo drill was advanced to the anterior third of
the vertebral body. The osteo drill was retracted. Through the
working cannula, a Kyphon inflatable bone tamp 15 x 2.5 was advanced
and positioned with the distal marker approximately 5 mm from the
anterior aspect of the cortex. Appropriate positioning was confirmed
on the AP projection. At this time, the balloon was expanded using
contrast via a Kyphon inflation syringe device via micro tubing.

The left pedicle at L2 was then infiltrated with 1% lidocaine
followed by the advancement of a Kyphon trocar needle through the
left pedicle into the posterior one-third of the vertebral body.
Subsequently, the osteo drill was advanced to the anterior third of
the vertebral body. The osteo drill was retracted. Through the
working cannula, a Kyphon inflatable bone tamp 15 x 2.5 was advanced
and positioned with the distal marker approximately 5 mm from the
anterior aspect of the cortex. Appropriate positioning was confirmed
on the AP projection. At this time, the balloon was expanded using
contrast via a Kyphon inflation syringe device via micro tubing.

Inflations were continued until there was near apposition with the
superior end plate. At this time, methylmethacrylate mixture was
reconstituted in the Kyphon bone mixing device system. This was then
loaded into the delivery mechanism, attached to the cement delivery
system (ANOOP).

The balloons were deflated and removed followed by the instillation
of methylmethacrylate mixture with excellent filling in the AP and
lateral projections. No extravasation was noted in the disk spaces
or posteriorly into the spinal canal. No epidural venous
contamination was seen. Excellent filling across the entirety of the
superior endplate of L1. Excellent preferential filling on the left
the site of fracture at L2.

The working cannulae and the bone filler were then retrieved and
removed. Hemostasis was achieved with manual compression. The
patient tolerated the procedure well without immediate
postprocedural complication.
IMPRESSION: 1. Technically successful L1 vertebral body augmentation using
balloon kyphoplasty.
2. Technically successful L2 vertebral body augmentation using
balloon kyphoplasty.
3. Technically successful right sacroiliac joint injection of
steroid and local anesthetic.
4. Per CMS PQRS reporting requirements (PQRS Measure 24): Given the
patient's age of greater than 50 and the fracture site (hip, distal
radius, or spine), the patient should be tested for osteoporosis
using DXA, and the appropriate treatment considered based on the DXA
results.

## 2022-05-07 MED ORDER — CEFAZOLIN SODIUM-DEXTROSE 2-4 GM/100ML-% IV SOLN
2.0000 g | INTRAVENOUS | Status: AC
  Administered 2022-05-07: 2 g via INTRAVENOUS

## 2022-05-07 MED ORDER — IOPAMIDOL (ISOVUE-M 200) INJECTION 41%
1.0000 mL | Freq: Once | INTRAMUSCULAR | Status: AC
  Administered 2022-05-07: 1 mL via INTRA_ARTICULAR

## 2022-05-07 MED ORDER — FENTANYL CITRATE PF 50 MCG/ML IJ SOSY
25.0000 ug | PREFILLED_SYRINGE | INTRAMUSCULAR | Status: DC | PRN
  Administered 2022-05-07 (×2): 25 ug via INTRAVENOUS
  Administered 2022-05-07: 50 ug via INTRAVENOUS
  Administered 2022-05-07 (×2): 25 ug via INTRAVENOUS

## 2022-05-07 MED ORDER — IOPAMIDOL (ISOVUE-M 200) INJECTION 41%: 5.0000 mL | Freq: Once | INTRAMUSCULAR | Status: DC

## 2022-05-07 MED ORDER — MIDAZOLAM HCL 2 MG/2ML IJ SOLN
1.0000 mg | INTRAMUSCULAR | Status: DC | PRN
  Administered 2022-05-07 (×6): 0.5 mg via INTRAVENOUS

## 2022-05-07 MED ORDER — METHYLPREDNISOLONE ACETATE 40 MG/ML INJ SUSP (RADIOLOG
80.0000 mg | Freq: Once | INTRAMUSCULAR | Status: AC
  Administered 2022-05-07: 80 mg via INTRA_ARTICULAR

## 2022-05-07 MED ORDER — SODIUM CHLORIDE 0.9 % IV SOLN: INTRAVENOUS | Status: DC

## 2022-05-07 MED ORDER — ACETAMINOPHEN 10 MG/ML IV SOLN
1000.0000 mg | Freq: Once | INTRAVENOUS | Status: AC
  Administered 2022-05-07: 1000 mg via INTRAVENOUS

## 2022-05-07 NOTE — Progress Notes (Signed)
Pt back in nursing recovery area. Pt still drowsy from procedure but will wake up when spoken to. Pt follows commands, talks in complete sentences and has no complaints at this time. Pt will remain in nursing station until discharge.  ?

## 2022-05-16 ENCOUNTER — Other Ambulatory Visit: Payer: Self-pay | Admitting: Interventional Radiology

## 2022-05-16 DIAGNOSIS — S32010A Wedge compression fracture of first lumbar vertebra, initial encounter for closed fracture: Secondary | ICD-10-CM

## 2022-05-16 DIAGNOSIS — S32020A Wedge compression fracture of second lumbar vertebra, initial encounter for closed fracture: Secondary | ICD-10-CM

## 2022-05-19 ENCOUNTER — Observation Stay
Admission: EM | Admit: 2022-05-19 | Discharge: 2022-05-20 | Disposition: A | Discharge status: Home / Self Care | Payer: No Typology Code available for payment source | Attending: Internal Medicine | Admitting: Internal Medicine

## 2022-05-19 ENCOUNTER — Emergency Department: Payer: No Typology Code available for payment source

## 2022-05-19 ENCOUNTER — Other Ambulatory Visit: Payer: Self-pay

## 2022-05-19 DIAGNOSIS — Z981 Arthrodesis status: Secondary | ICD-10-CM | POA: Diagnosis not present

## 2022-05-19 DIAGNOSIS — W19XXXA Unspecified fall, initial encounter: Secondary | ICD-10-CM

## 2022-05-19 DIAGNOSIS — Z79899 Other long term (current) drug therapy: Secondary | ICD-10-CM | POA: Insufficient documentation

## 2022-05-19 DIAGNOSIS — Z7984 Long term (current) use of oral hypoglycemic drugs: Secondary | ICD-10-CM | POA: Diagnosis not present

## 2022-05-19 DIAGNOSIS — E871 Hypo-osmolality and hyponatremia: Secondary | ICD-10-CM | POA: Insufficient documentation

## 2022-05-19 DIAGNOSIS — S0101XA Laceration without foreign body of scalp, initial encounter: Secondary | ICD-10-CM | POA: Diagnosis not present

## 2022-05-19 DIAGNOSIS — R55 Syncope and collapse: Secondary | ICD-10-CM | POA: Diagnosis not present

## 2022-05-19 DIAGNOSIS — S0990XA Unspecified injury of head, initial encounter: Secondary | ICD-10-CM | POA: Diagnosis not present

## 2022-05-19 DIAGNOSIS — E1165 Type 2 diabetes mellitus with hyperglycemia: Secondary | ICD-10-CM | POA: Insufficient documentation

## 2022-05-19 DIAGNOSIS — W01190A Fall on same level from slipping, tripping and stumbling with subsequent striking against furniture, initial encounter: Secondary | ICD-10-CM | POA: Insufficient documentation

## 2022-05-19 DIAGNOSIS — I493 Ventricular premature depolarization: Secondary | ICD-10-CM | POA: Insufficient documentation

## 2022-05-19 DIAGNOSIS — R000 Tachycardia, unspecified: Secondary | ICD-10-CM | POA: Diagnosis not present

## 2022-05-19 DIAGNOSIS — M47812 Spondylosis without myelopathy or radiculopathy, cervical region: Secondary | ICD-10-CM | POA: Diagnosis not present

## 2022-05-19 DIAGNOSIS — I10 Essential (primary) hypertension: Secondary | ICD-10-CM | POA: Insufficient documentation

## 2022-05-19 DIAGNOSIS — S199XXA Unspecified injury of neck, initial encounter: Secondary | ICD-10-CM | POA: Diagnosis not present

## 2022-05-19 LAB — URINALYSIS, ROUTINE W REFLEX MICROSCOPIC
Bilirubin Urine: NEGATIVE
Glucose, UA: NEGATIVE mg/dL
Hgb urine dipstick: NEGATIVE
Ketones, ur: NEGATIVE mg/dL
Leukocytes,Ua: NEGATIVE
Nitrite: NEGATIVE
Protein, ur: NEGATIVE mg/dL
Specific Gravity, Urine: 1.008 (ref 1.005–1.030)
pH: 5 (ref 5.0–8.0)

## 2022-05-19 LAB — BASIC METABOLIC PANEL
Anion gap: 12 (ref 5–15)
BUN: 11 mg/dL (ref 8–23)
CO2: 22 mmol/L (ref 22–32)
Calcium: 9.1 mg/dL (ref 8.9–10.3)
Chloride: 89 mmol/L — ABNORMAL LOW (ref 98–111)
Creatinine, Ser: 0.67 mg/dL (ref 0.61–1.24)
GFR, Estimated: >60 mL/min (ref >60)
Glucose, Bld: 136 mg/dL — ABNORMAL HIGH (ref 70–99)
Potassium: 4.2 mmol/L (ref 3.5–5.1)
Sodium: 123 mmol/L — ABNORMAL LOW (ref 135–145)

## 2022-05-19 LAB — CBC
HCT: 36.9 % — ABNORMAL LOW (ref 39.0–52.0)
Hemoglobin: 13.1 g/dL (ref 13.0–17.0)
MCH: 35.3 pg — ABNORMAL HIGH (ref 26.0–34.0)
MCHC: 35.5 g/dL (ref 30.0–36.0)
MCV: 99.5 fL (ref 80.0–100.0)
Platelets: 254 10*3/uL (ref 150–400)
RBC: 3.71 MIL/uL — ABNORMAL LOW (ref 4.22–5.81)
RDW: 11.6 % (ref 11.5–15.5)
WBC: 13.4 10*3/uL — ABNORMAL HIGH (ref 4.0–10.5)
nRBC: 0 % (ref 0.0–0.2)

## 2022-05-19 LAB — SODIUM, URINE, RANDOM: Sodium, Ur: 46 mmol/L

## 2022-05-19 LAB — HEMOGLOBIN A1C
Hgb A1c MFr Bld: 4.8 % (ref 4.8–5.6)
Mean Plasma Glucose: 91.06 mg/dL

## 2022-05-19 LAB — OSMOLALITY, URINE: Osmolality, Ur: 252 mOsm/kg — ABNORMAL LOW (ref 300–900)

## 2022-05-19 LAB — TSH: TSH: 2.505 u[IU]/mL (ref 0.350–4.500)

## 2022-05-19 LAB — GLUCOSE, CAPILLARY
Glucose-Capillary: 122 mg/dL — ABNORMAL HIGH (ref 70–99)
Glucose-Capillary: 170 mg/dL — ABNORMAL HIGH (ref 70–99)

## 2022-05-19 LAB — OSMOLALITY: Osmolality: 259 mOsm/kg — ABNORMAL LOW (ref 275–295)

## 2022-05-19 MED ORDER — GABAPENTIN 100 MG PO CAPS
200.0000 mg | ORAL_CAPSULE | Freq: Every day | ORAL | Status: DC
  Administered 2022-05-19: 200 mg via ORAL
  Filled 2022-05-19: qty 2

## 2022-05-19 MED ORDER — SODIUM CHLORIDE 1 G PO TABS
1.0000 g | ORAL_TABLET | Freq: Two times a day (BID) | ORAL | Status: DC
  Administered 2022-05-19 – 2022-05-20 (×2): 1 g via ORAL
  Filled 2022-05-19 (×3): qty 1

## 2022-05-19 MED ORDER — LISINOPRIL 20 MG PO TABS
20.0000 mg | ORAL_TABLET | Freq: Every day | ORAL | Status: DC
  Administered 2022-05-20: 20 mg via ORAL
  Filled 2022-05-19 (×2): qty 1

## 2022-05-19 MED ORDER — LORAZEPAM 2 MG/ML IJ SOLN: 1.0000 mg | INTRAMUSCULAR | Status: DC | PRN

## 2022-05-19 MED ORDER — THIAMINE HCL 100 MG PO TABS
100.0000 mg | ORAL_TABLET | Freq: Every day | ORAL | Status: DC
  Administered 2022-05-19 – 2022-05-20 (×2): 100 mg via ORAL
  Filled 2022-05-19 (×2): qty 1

## 2022-05-19 MED ORDER — SODIUM CHLORIDE 0.9% FLUSH
3.0000 mL | Freq: Two times a day (BID) | INTRAVENOUS | Status: DC
  Administered 2022-05-19 – 2022-05-20 (×2): 3 mL via INTRAVENOUS

## 2022-05-19 MED ORDER — GABAPENTIN 100 MG PO CAPS
100.0000 mg | ORAL_CAPSULE | Freq: Three times a day (TID) | ORAL | Status: DC
  Filled 2022-05-19: qty 1

## 2022-05-19 MED ORDER — INSULIN ASPART 100 UNIT/ML IJ SOLN
0.0000 [IU] | Freq: Three times a day (TID) | INTRAMUSCULAR | Status: DC
  Filled 2022-05-19 (×2): qty 1

## 2022-05-19 MED ORDER — METOPROLOL TARTRATE 25 MG PO TABS
12.5000 mg | ORAL_TABLET | Freq: Two times a day (BID) | ORAL | Status: DC
  Administered 2022-05-20: 12.5 mg via ORAL
  Filled 2022-05-19: qty 1

## 2022-05-19 MED ORDER — LEVOTHYROXINE SODIUM 25 MCG PO TABS
25.0000 ug | ORAL_TABLET | Freq: Every day | ORAL | Status: DC
  Administered 2022-05-20: 25 ug via ORAL
  Filled 2022-05-19: qty 1

## 2022-05-19 MED ORDER — ADULT MULTIVITAMIN W/MINERALS CH
1.0000 | ORAL_TABLET | Freq: Every day | ORAL | Status: DC
  Administered 2022-05-19 – 2022-05-20 (×2): 1 via ORAL
  Filled 2022-05-19 (×2): qty 1

## 2022-05-19 MED ORDER — METFORMIN HCL 500 MG PO TABS
500.0000 mg | ORAL_TABLET | Freq: Every day | ORAL | Status: DC
  Administered 2022-05-20: 500 mg via ORAL
  Filled 2022-05-19: qty 1

## 2022-05-19 MED ORDER — METOPROLOL TARTRATE 25 MG PO TABS: 12.5000 mg | ORAL_TABLET | Freq: Two times a day (BID) | ORAL | Status: DC

## 2022-05-19 MED ORDER — CYCLOBENZAPRINE HCL 10 MG PO TABS: 10.0000 mg | ORAL_TABLET | Freq: Three times a day (TID) | ORAL | Status: DC | PRN

## 2022-05-19 MED ORDER — ATORVASTATIN CALCIUM 10 MG PO TABS
10.0000 mg | ORAL_TABLET | Freq: Every day | ORAL | Status: DC
  Administered 2022-05-19: 10 mg via ORAL
  Filled 2022-05-19 (×2): qty 1

## 2022-05-19 MED ORDER — LORAZEPAM 1 MG PO TABS: 1.0000 mg | ORAL_TABLET | ORAL | Status: DC | PRN

## 2022-05-19 MED ORDER — GABAPENTIN 100 MG PO CAPS
100.0000 mg | ORAL_CAPSULE | Freq: Two times a day (BID) | ORAL | Status: DC
  Filled 2022-05-19: qty 1

## 2022-05-19 MED ORDER — THIAMINE HCL 100 MG/ML IJ SOLN
100.0000 mg | Freq: Every day | INTRAMUSCULAR | Status: DC
Start: 2022-05-19 — End: 2022-05-20
  Filled 2022-05-19: qty 2

## 2022-05-19 MED ORDER — LIDOCAINE-EPINEPHRINE 2 %-1:100000 IJ SOLN
20.0000 mL | Freq: Once | INTRAMUSCULAR | Status: AC
  Administered 2022-05-19: 20 mL
  Filled 2022-05-19: qty 1

## 2022-05-19 MED ORDER — FOLIC ACID 1 MG PO TABS
1.0000 mg | ORAL_TABLET | Freq: Every day | ORAL | Status: DC
  Administered 2022-05-19 – 2022-05-20 (×2): 1 mg via ORAL
  Filled 2022-05-19 (×2): qty 1

## 2022-05-19 MED ORDER — HYDROCODONE-ACETAMINOPHEN 10-325 MG PO TABS: 1.0000 | ORAL_TABLET | Freq: Four times a day (QID) | ORAL | Status: DC | PRN

## 2022-05-19 MED ORDER — ZOLPIDEM TARTRATE 5 MG PO TABS
10.0000 mg | ORAL_TABLET | Freq: Every evening | ORAL | Status: DC | PRN
Start: 2022-05-19 — End: 2022-05-20
  Administered 2022-05-19: 10 mg via ORAL
  Filled 2022-05-19: qty 2

## 2022-05-19 NOTE — ED Notes (Signed)
Pt's scalp cleaned with sterile water.

## 2022-05-19 NOTE — ED Provider Notes (Signed)
[facility] Provider Note    Event Date/Time   First MD Initiated Contact with Patient 05/19/22 1159     (approximate)   History   Chief Complaint Fall   HPI  Joseph Howell is a 76 y.o. male with past medical history of hypertension, hyperlipidemia, and diabetes who presents to the ED complaining of fall.  Patient reports that just prior to arrival he was standing by his window when he suddenly lost his balance and fell backwards, striking his head on a piece of furniture.  He denies losing consciousness and does not take any blood thinners, does state that he noted significant bleeding from the back of his head.  He denies any pain in his neck, chest, abdomen, or extremities following the fall.  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  He does admit to drinking at least 2 alcoholic beverages on a daily basis.     Physical Exam   Triage Vital Signs: ED Triage Vitals  Enc Vitals Group     BP 05/19/22 1132 122/69     Pulse Rate 05/19/22 1132 (!) 112     Resp 05/19/22 1132 20     Temp 05/19/22 1132 98.4 F (36.9 C)     Temp Source 05/19/22 1132 Oral     SpO2 05/19/22 1132 96 %     Weight 05/19/22 1235 207 lb (93.9 kg)     Height 05/19/22 1235 6\' 4"  (1.93 m)     Head Circumference --      Peak Flow --      Pain Score 05/19/22 1121 0     Pain Loc --      Pain Edu? --      Excl. in GC? --     Most recent vital signs: Vitals:   05/19/22 1357 05/19/22 1358  BP:  (!) 167/81  Pulse: 94 98  Resp:  18  Temp:    SpO2: 100% 99%    Constitutional: Alert and oriented. Eyes: Conjunctivae are normal. Head: Approximately 4 cm laceration to posterior scalp, no active bleeding noted. Nose: No congestion/rhinnorhea. Mouth/Throat: Mucous membranes are moist.  Neck: No midline cervical spine tenderness to palpation. Cardiovascular: Normal rate, regular rhythm.  Grossly normal heart sounds.  2+ radial pulses bilaterally. Respiratory: Normal respiratory effort.  No retractions. Lungs CTAB.  No chest wall tenderness to palpation. Gastrointestinal: Soft and nontender. No distention. Musculoskeletal: No lower extremity tenderness nor edema.  No upper extremity bony tenderness to palpation. Neurologic:  Normal speech and language. No gross focal neurologic deficits are appreciated.    ED Results / Procedures / Treatments   Labs (all labs ordered are listed, but only abnormal results are displayed) Labs Reviewed  BASIC METABOLIC PANEL - Abnormal; Notable for the following components:      Result Value   Sodium 123 (*)    Chloride 89 (*)    Glucose, Bld 136 (*)    All other components within normal limits  CBC - Abnormal; Notable for the following components:   WBC 13.4 (*)    RBC 3.71 (*)    HCT 36.9 (*)    MCH 35.3 (*)    All other components within normal limits  URINALYSIS, ROUTINE W REFLEX MICROSCOPIC  OSMOLALITY, URINE  OSMOLALITY  SODIUM, URINE, RANDOM     EKG  ED ECG REPORT I, Chesley Noon, the attending physician, personally  viewed and interpreted this ECG.   Date: 05/19/2022  EKG Time: 11:31  Rate: 113  Rhythm: sinus tachycardia, frequent PVC's noted  Axis: LAD  Intervals:nonspecific intraventricular conduction delay  ST&T Change: None  RADIOLOGY CT head reviewed and interpreted by me with no hemorrhage or midline shift.  PROCEDURES:  Critical Care performed: No  .Critical Care  Performed by: Chesley Noon, MD Authorized by: Chesley Noon, MD   Critical care provider statement:    Critical care time (minutes):  30   Critical care time was exclusive of:  Separately billable procedures and treating other patients and teaching time   Critical care was necessary to treat or prevent imminent or life-threatening deterioration of the following conditions:  Metabolic crisis   Critical care was time spent  personally by me on the following activities:  Development of treatment plan with patient or surrogate, discussions with consultants, evaluation of patient's response to treatment, examination of patient, ordering and review of laboratory studies, ordering and review of radiographic studies, ordering and performing treatments and interventions, pulse oximetry, re-evaluation of patient's condition and review of old charts   I assumed direction of critical care for this patient from another provider in my specialty: no     Care discussed with: admitting provider   .Marland KitchenLaceration Repair  Date/Time: 05/19/2022 2:25 PM  Performed by: Chesley Noon, MD Authorized by: Chesley Noon, MD   Consent:    Consent obtained:  Verbal   Consent given by:  Patient Universal protocol:    Patient identity confirmed:  Verbally with patient and arm band Anesthesia:    Anesthesia method:  Local infiltration   Local anesthetic:  Lidocaine 2% WITH epi Laceration details:    Location:  Scalp   Scalp location:  Occipital Pre-procedure details:    Preparation:  Patient was prepped and draped in usual sterile fashion and imaging obtained to evaluate for foreign bodies Exploration:    Limited defect created (wound extended): no     Imaging obtained comment:  CT   Imaging outcome: foreign body not noted     Wound exploration: wound explored through full range of motion and entire depth of wound visualized     Wound extent: no areolar tissue violation noted, no fascia violation noted, no foreign bodies/material noted, no muscle damage noted, no nerve damage noted, no tendon damage noted, no underlying fracture noted and no vascular damage noted     Contaminated: no   Treatment:    Area cleansed with:  Saline   Amount of cleaning:  Standard   Irrigation solution:  Sterile saline   Irrigation method:  Pressure wash   Visualized foreign bodies/material removed: no     Debridement:  None   Undermining:  None    Scar revision: no   Skin repair:    Repair method:  Staples   Number of staples:  8 Approximation:    Approximation:  Loose Repair type:    Repair type:  Simple Post-procedure details:    Dressing:  Sterile dressing   Procedure completion:  Tolerated well, no immediate complications    MEDICATIONS ORDERED IN ED: Medications  lidocaine-EPINEPHrine (XYLOCAINE W/EPI) 2 %-1:100000 (with pres) injection 20 mL (has no administration in time range)     IMPRESSION / MDM / ASSESSMENT AND PLAN / ED COURSE  I reviewed the triage vital signs and the nursing notes.  76 y.o. male with past medical history of hypertension, hyperlipidemia, and diabetes who presents to the ED complaining of episode of loss of balance and fall, striking the back of his head on a piece of furniture.  Patient's presentation is most consistent with acute presentation with potential threat to life or bodily function.  Differential diagnosis includes, but is not limited to, intracranial injury, cervical spine injury, syncope, arrhythmia, electrolyte abnormality, dehydration, alcohol abuse.  Patient nontoxic-appearing and in no acute distress, vital signs are unremarkable and he has a nonfocal neurologic exam.  CT head and cervical spine are negative for acute traumatic injury, patient does have significant posterior scalp laceration that will require repair.  No evidence of traumatic injury to his trunk or extremities.  EKG shows no evidence of arrhythmia or ischemia.  Labs remarkable for hyponatremia at 123, no AKI or anemia noted.  Suspect hyponatremia is due to excess oral fluid intake given his daily alcohol consumption and we will hold off on IV fluids for now.  Laceration anesthetized with lidocaine and repaired with staples.  Case discussed with hospitalist for admission.      FINAL CLINICAL IMPRESSION(S) / ED DIAGNOSES   Final diagnoses:  Fall, initial encounter  Laceration of  scalp, initial encounter  Hyponatremia     Rx / DC Orders   ED Discharge Orders     None        Note:  This document was prepared using Dragon voice recognition software and may include unintentional dictation errors.   Chesley Noon, MD 05/19/22 1426

## 2022-05-20 ENCOUNTER — Observation Stay
Admit: 2022-05-20 | Discharge: 2022-05-20 | Disposition: A | Discharge status: Home / Self Care | Payer: No Typology Code available for payment source | Attending: Internal Medicine | Admitting: Internal Medicine

## 2022-05-20 DIAGNOSIS — R55 Syncope and collapse: Secondary | ICD-10-CM | POA: Diagnosis not present

## 2022-05-20 LAB — BASIC METABOLIC PANEL
Anion gap: 9 (ref 5–15)
Anion gap: 12 (ref 5–15)
BUN: 14 mg/dL (ref 8–23)
BUN: 12 mg/dL (ref 8–23)
CO2: 27 mmol/L (ref 22–32)
CO2: 28 mmol/L (ref 22–32)
Calcium: 8.8 mg/dL — ABNORMAL LOW (ref 8.9–10.3)
Calcium: 9.4 mg/dL (ref 8.9–10.3)
Chloride: 87 mmol/L — ABNORMAL LOW (ref 98–111)
Chloride: 87 mmol/L — ABNORMAL LOW (ref 98–111)
Creatinine, Ser: 0.63 mg/dL (ref 0.61–1.24)
Creatinine, Ser: 0.73 mg/dL (ref 0.61–1.24)
GFR, Estimated: >60 mL/min (ref >60)
GFR, Estimated: >60 mL/min (ref >60)
Glucose, Bld: 115 mg/dL — ABNORMAL HIGH (ref 70–99)
Glucose, Bld: 108 mg/dL — ABNORMAL HIGH (ref 70–99)
Potassium: 3.9 mmol/L (ref 3.5–5.1)
Potassium: 4 mmol/L (ref 3.5–5.1)
Sodium: 123 mmol/L — ABNORMAL LOW (ref 135–145)
Sodium: 127 mmol/L — ABNORMAL LOW (ref 135–145)

## 2022-05-20 LAB — CBC
HCT: 32.4 % — ABNORMAL LOW (ref 39.0–52.0)
Hemoglobin: 11.7 g/dL — ABNORMAL LOW (ref 13.0–17.0)
MCH: 35.7 pg — ABNORMAL HIGH (ref 26.0–34.0)
MCHC: 36.1 g/dL — ABNORMAL HIGH (ref 30.0–36.0)
MCV: 98.8 fL (ref 80.0–100.0)
Platelets: 188 10*3/uL (ref 150–400)
RBC: 3.28 MIL/uL — ABNORMAL LOW (ref 4.22–5.81)
RDW: 11.3 % — ABNORMAL LOW (ref 11.5–15.5)
WBC: 13.2 10*3/uL — ABNORMAL HIGH (ref 4.0–10.5)
nRBC: 0 % (ref 0.0–0.2)

## 2022-05-20 LAB — GLUCOSE, CAPILLARY
Glucose-Capillary: 135 mg/dL — ABNORMAL HIGH (ref 70–99)
Glucose-Capillary: 126 mg/dL — ABNORMAL HIGH (ref 70–99)
Glucose-Capillary: 114 mg/dL — ABNORMAL HIGH (ref 70–99)
Glucose-Capillary: 121 mg/dL — ABNORMAL HIGH (ref 70–99)

## 2022-05-20 LAB — ECHOCARDIOGRAM COMPLETE
AR max vel: 2.27 cm2
AV Area VTI: 2.45 cm2
AV Area mean vel: 2.18 cm2
AV Mean grad: 2 mmHg
AV Peak grad: 3.7 mmHg
Ao pk vel: 0.96 m/s
Area-P 1/2: 5.75 cm2
Height: 76 in
Weight: 3340.8 oz

## 2022-05-20 MED ORDER — SODIUM CHLORIDE 1 G PO TABS: 2.0000 g | ORAL_TABLET | Freq: Two times a day (BID) | ORAL | 0 refills | Status: DC

## 2022-05-20 MED ORDER — THIAMINE HCL 100 MG PO TABS: 100.0000 mg | ORAL_TABLET | Freq: Every day | ORAL | 0 refills | Status: AC

## 2022-05-20 MED ORDER — SODIUM CHLORIDE 1 G PO TABS: 2.0000 g | ORAL_TABLET | Freq: Three times a day (TID) | ORAL | Status: DC

## 2022-05-20 MED ORDER — METOPROLOL TARTRATE 25 MG PO TABS: 12.5000 mg | ORAL_TABLET | Freq: Two times a day (BID) | ORAL | 0 refills | Status: AC

## 2022-05-20 MED ORDER — ADULT MULTIVITAMIN W/MINERALS CH: 1.0000 | ORAL_TABLET | Freq: Every day | ORAL | 0 refills | Status: AC

## 2022-05-20 MED ORDER — SODIUM CHLORIDE 1 G PO TABS
2.0000 g | ORAL_TABLET | Freq: Three times a day (TID) | ORAL | Status: DC
  Administered 2022-05-20 (×2): 2 g via ORAL
  Filled 2022-05-20 (×2): qty 2

## 2022-05-20 MED ORDER — SODIUM CHLORIDE 1 G PO TABS: 2.0000 g | ORAL_TABLET | Freq: Every day | ORAL | 0 refills | Status: AC

## 2022-05-20 MED ORDER — FOLIC ACID 1 MG PO TABS: 1.0000 mg | ORAL_TABLET | Freq: Every day | ORAL | 0 refills | Status: AC

## 2022-05-20 NOTE — Progress Notes (Signed)
Patient being discharged home. IV removed. Went over discharge instructions and medications with patient. Patient stated he understood. Patient going home POV with son.

## 2022-05-30 ENCOUNTER — Ambulatory Visit
Admission: RE | Admit: 2022-05-30 | Discharge: 2022-05-30 | Disposition: A | Discharge status: Home / Self Care | Payer: Medicare HMO | Source: Ambulatory Visit | Attending: Interventional Radiology | Admitting: Interventional Radiology

## 2022-05-30 DIAGNOSIS — S32010A Wedge compression fracture of first lumbar vertebra, initial encounter for closed fracture: Secondary | ICD-10-CM

## 2022-05-30 DIAGNOSIS — Z4802 Encounter for removal of sutures: Secondary | ICD-10-CM | POA: Diagnosis not present

## 2022-05-30 DIAGNOSIS — R55 Syncope and collapse: Secondary | ICD-10-CM | POA: Diagnosis not present

## 2022-05-30 DIAGNOSIS — E1140 Type 2 diabetes mellitus with diabetic neuropathy, unspecified: Secondary | ICD-10-CM | POA: Diagnosis not present

## 2022-05-30 DIAGNOSIS — Z9889 Other specified postprocedural states: Secondary | ICD-10-CM | POA: Diagnosis not present

## 2022-05-30 DIAGNOSIS — S0191XD Laceration without foreign body of unspecified part of head, subsequent encounter: Secondary | ICD-10-CM | POA: Diagnosis not present

## 2022-05-30 DIAGNOSIS — M533 Sacrococcygeal disorders, not elsewhere classified: Secondary | ICD-10-CM | POA: Diagnosis not present

## 2022-05-30 DIAGNOSIS — S32020A Wedge compression fracture of second lumbar vertebra, initial encounter for closed fracture: Secondary | ICD-10-CM

## 2022-05-30 DIAGNOSIS — I10 Essential (primary) hypertension: Secondary | ICD-10-CM | POA: Diagnosis not present

## 2022-05-30 DIAGNOSIS — S32020D Wedge compression fracture of second lumbar vertebra, subsequent encounter for fracture with routine healing: Secondary | ICD-10-CM | POA: Diagnosis not present

## 2022-05-30 DIAGNOSIS — W19XXXD Unspecified fall, subsequent encounter: Secondary | ICD-10-CM | POA: Diagnosis not present

## 2022-05-30 DIAGNOSIS — S32010D Wedge compression fracture of first lumbar vertebra, subsequent encounter for fracture with routine healing: Secondary | ICD-10-CM | POA: Diagnosis not present

## 2022-05-30 DIAGNOSIS — R5383 Other fatigue: Secondary | ICD-10-CM | POA: Diagnosis not present

## 2022-05-30 DIAGNOSIS — E871 Hypo-osmolality and hyponatremia: Secondary | ICD-10-CM | POA: Diagnosis not present

## 2022-05-30 HISTORY — PX: IR RADIOLOGIST EVAL & MGMT: IMG5224

## 2022-05-30 NOTE — Progress Notes (Signed)
Chief Complaint: Patient was consulted remotely today (TeleHealth) for  Right sacroiliac joint dysfunction, L1 and L2 compression fractures now status post cement augmentation with kyphoplasty at the request of Joseph Howell.    Referring Physician(s): Joseph Howell  History of Present Illness: Joseph Howell is a 76 y.o. male 76 year old gentleman doing remarkably well status post combined L1 and L2 kyphoplasty as well as right sacroiliac joint injection.  He reports that the SI joint injection has remarkably helped his right SI joint.  Additionally, his back pain is feeling much better.  He continues to have pain along his left lower extremity from a fall he suffered the day before his procedure with me.  This seems to be healing albeit slowly.  With a history of Meniere's disease, vertigo and frequent falls resulting in acute/subacute L1 and L2 compression fractures.  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  This had been bothering him significantly for more than 6 months since he moved from [place] to [place].    On 05/07/2022 he underwent L1 and L2 kyphoplasty augmentation as well as right sacroiliac joint injection of steroid and anesthetic.  We spoke over the phone today for his of follow-up evaluation.    He is pleased to report that he is feeling much better.  His right sacroiliac joint has come down significantly and his back pain is almost completely resolved.  He does have persistent pain along his left leg from a fall he suffered the night before his procedure with me.  This is healing but not as quickly as he would like.  Additionally, he suffered another fall more recently where he hit his head on his hardwood floors and required 8 staples.    Otherwise, he is doing well.    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 12/21/2013   dupuytrens release R hand 02/06/21 Right    FOOT SURGERY Bilateral in the 90's   Plantar Fibromatosis   HAND SURGERY  [id number]    Multiple (Dupuytrens Contracture)   IR KYPHO EA ADDL LEVEL THORACIC OR LUMBAR  05/07/2022   IR KYPHO LUMBAR INC FX REDUCE BONE BX UNI/BIL CANNULATION INC/IMAGING  05/07/2022   KNEE ARTHROSCOPY WITH MEDIAL MENISECTOMY Left 2002   PIP JOINT FUSION Right 07/16/2019   ROTATOR CUFF REPAIR Left 2003   TONSILLECTOMY AND ADENOIDECTOMY  1951   ULNAR TUNNEL RELEASE Left 03/03/2016   VASECTOMY  1986   XI ROBOTIC ASSISTED SIMPLE PROSTATECTOMY  02/19/2019    Allergies: Penicillins and Sulfa antibiotics  Medications: Prior to Admission medications   Medication Sig Start Date End Date Taking? Authorizing Provider  acetaminophen-codeine (TYLENOL #3) 300-30 MG tablet Take 1 tablet by mouth every 6 (six) hours as needed. 04/18/22   [provider]  atorvastatin (LIPITOR) 10 MG tablet Take 1 tablet (10 mg total) by mouth at bedtime. 03/27/21   Cletis Athens, MD  folic acid (FOLVITE) 1 MG tablet Take 1 tablet (1 mg total) by mouth daily. 05/21/22 05/16/23  Kayleen Memos, DO  gabapentin (NEURONTIN) 100  MG capsule Take 200 mg by mouth at bedtime.    [provider]  glucose blood (ONETOUCH VERIO) test strip CHECK BLOOD SUGAR ONCE DAILY BEFORE BREAKFAST 12/19/21   Cletis Athens, MD  HYDROcodone-acetaminophen (NORCO) 10-325 MG tablet Take 1 tablet by mouth every 6 (six) hours as needed. 04/25/22   Tyson Alias, NP  Lancets (ONETOUCH DELICA PLUS [id number]) MISC CHECK ONE TIME DAILY BEFORE BREAKFAST 12/19/21   Cletis Athens, MD  levothyroxine (SYNTHROID) 25 MCG tablet Take 25 mcg by  mouth daily before breakfast.    [provider]  metFORMIN (GLUCOPHAGE) 500 MG tablet Take 1 tablet (500 mg total) by mouth daily. 03/27/21   Cletis Athens, MD  metoprolol tartrate (LOPRESSOR) 25 MG tablet Take 0.5 tablets (12.5 mg total) by mouth 2 (two) times daily. 05/20/22 06/19/22  Kayleen Memos, DO  Multiple Vitamin (MULTIVITAMIN WITH MINERALS) TABS tablet Take 1 tablet by mouth daily. 05/21/22 05/16/23  Kayleen Memos, DO  sodium chloride 1 g tablet Take 2 tablets (2 g total) by mouth daily for 14 days. 05/20/22 06/03/22  Kayleen Memos, DO  thiamine 100 MG tablet Take 1 tablet (100 mg total) by mouth daily. 05/21/22 08/19/22  Kayleen Memos, DO     Family History  Adopted: Yes  Family history unknown: Yes    Social History   Socioeconomic History   Marital status: Divorced    Spouse name: Not on file   Number of children: Not on file   Years of education: Not on file   Highest education level: Not on file  Occupational History   Not on file  Tobacco Use   Smoking status: Never   Smokeless tobacco: Never  Vaping Use   Vaping Use: Never used  Substance and Sexual Activity   Alcohol use: Yes    Comment: 2 daily   Drug use: Never   Sexual activity: Not Currently  Other Topics Concern   Not on file  Social History Narrative   Not on file   Social Determinants of Health   Financial Resource Strain: Not on file  Food Insecurity: Not on file  Transportation Needs: Not on file  Physical Activity: Not on file  Stress: Not on file  Social Connections: Not on file     Review of Systems  Review of Systems: A 12 point ROS discussed and pertinent positives are indicated in the HPI above.  All other systems are negative.    Physical Exam No direct physical exam was performed (except for noted visual exam findings with Video Visits).    Vital Signs: There were no vitals taken for this visit.  Imaging: ECHOCARDIOGRAM COMPLETE  Result Date: 05/20/2022     ECHOCARDIOGRAM REPORT   Patient Name:   Joseph Howell Date of Exam: 05/20/2022 Medical Rec #:  [id number]      Height:       76.0 in Accession #:    [id number]     Weight:       208.8 lb Date of Birth:  Nov 23, 1946      BSA:          2.256 m Patient Age:    62 years       BP:           139/74 mmHg Patient Gender: M              HR:           85 bpm. Exam Location:  [facility]  Procedure: 2D Echo, Cardiac Doppler and Color Doppler Indications:     Syncope R55  History:         Patient has no prior history of Echocardiogram examinations.                  Risk Factors:Diabetes, Hypertension and Dyslipidemia.  Sonographer:     Sherrie Sport Referring Phys:  [id number] Lequita Halt Diagnosing Phys: Neoma Laming  Sonographer Comments: Suboptimal parasternal window. IMPRESSIONS  1. Left ventricular ejection fraction, by estimation, is 60 to 65%. The left ventricle has normal function. The left ventricle has no regional wall motion abnormalities. There is severe concentric left ventricular hypertrophy. Left ventricular diastolic  parameters are consistent with Grade I diastolic dysfunction (impaired relaxation).  2. Right ventricular systolic function is normal. The right ventricular size is normal.  3. The mitral valve is normal in structure. No evidence of mitral valve regurgitation. No evidence of mitral stenosis.  4. The aortic valve is normal in structure. Aortic valve regurgitation is not visualized. Aortic valve sclerosis is present, with no evidence of aortic valve stenosis.  5. The inferior vena cava is normal in size with greater than 50% respiratory variability, suggesting right atrial pressure of 3 mmHg. FINDINGS  Left Ventricle: Left ventricular ejection fraction, by estimation, is 60 to 65%. The left ventricle has normal function. The left ventricle has no regional wall motion abnormalities. The left ventricular internal cavity size was normal in size. There is  severe concentric left ventricular hypertrophy. Left  ventricular diastolic parameters are consistent with Grade I diastolic dysfunction (impaired relaxation). Right Ventricle: The right ventricular size is normal. No increase in right ventricular wall thickness. Right ventricular systolic function is normal. Left Atrium: Left atrial size was normal in size. Right Atrium: Right atrial size was normal in size. Pericardium: There is no evidence of pericardial effusion. Mitral Valve: The mitral valve is normal in structure. No evidence of mitral valve regurgitation. No evidence of mitral valve stenosis. Tricuspid Valve: The tricuspid valve is normal in structure. Tricuspid valve regurgitation is not demonstrated. No evidence of tricuspid stenosis. Aortic Valve: The aortic valve is normal in structure. Aortic valve regurgitation is not visualized. Aortic valve sclerosis is present, with no evidence of aortic valve stenosis. Aortic valve mean gradient measures 2.0 mmHg. Aortic valve peak gradient measures 3.7 mmHg. Aortic valve area, by VTI measures 2.45 cm. Pulmonic Valve: The pulmonic valve was normal in structure. Pulmonic valve regurgitation is not visualized. No evidence of pulmonic stenosis. Aorta: The aortic root is normal in size and structure. Venous: The inferior vena cava is normal in size with greater than 50% respiratory variability, suggesting right atrial pressure of 3 mmHg. IAS/Shunts: No atrial level shunt detected by color flow Doppler.  LEFT VENTRICLE PLAX 2D LVOT diam:     2.00 cm   Diastology LV SV:         35        LV e' medial:    6.85 cm/s LV SV Index:   16        LV E/e' medial:  10.0 LVOT Area:     3.14 cm  LV e' lateral:   8.38 cm/s                          LV E/e' lateral: 8.2  RIGHT VENTRICLE RV Basal diam:  3.60 cm RV S prime:     17.00 cm/s TAPSE (M-mode): 2.6 cm  LEFT ATRIUM              Index        RIGHT ATRIUM           Index LA Vol (A2C):   110.0 ml 48.76 ml/m  RA Area:     15.70 cm LA Vol (A4C):   39.2 ml  17.37 ml/m  RA Volume:    37.60 ml  16.67 ml/m LA Biplane Vol: 71.4 ml  31.65 ml/m  AORTIC VALVE AV Area (Vmax):    2.27 cm AV Area (Vmean):   2.18 cm AV Area (VTI):     2.45 cm AV Vmax:           95.95 cm/s AV Vmean:          67.600 cm/s AV VTI:            0.145 m AV Peak Grad:      3.7 mmHg AV Mean Grad:      2.0 mmHg LVOT Vmax:         69.20 cm/s LVOT Vmean:        47.000 cm/s LVOT VTI:          0.113 m LVOT/AV VTI ratio: 0.78  AORTA Ao Root diam: 3.30 cm MITRAL VALVE               TRICUSPID VALVE MV Area (PHT): 5.75 cm    TR Peak grad:   8.9 mmHg MV Decel Time: 132 msec    TR Vmax:        149.00 cm/s MV E velocity: 68.60 cm/s MV A velocity: 93.00 cm/s  SHUNTS MV E/A ratio:  0.74        Systemic VTI:  0.11 m                            Systemic Diam: 2.00 cm Neoma Laming Electronically signed by Neoma Laming Signature Date/Time: 05/20/2022/9:23:36 AM    Final    CT HEAD WO CONTRAST (5MM)  Result Date: 05/19/2022 CLINICAL DATA:  Fall with head laceration. EXAM: CT HEAD WITHOUT CONTRAST CT CERVICAL SPINE WITHOUT CONTRAST TECHNIQUE: Multidetector CT imaging of the head and cervical spine was performed following the standard protocol without intravenous contrast. Multiplanar CT image reconstructions of the cervical spine were also generated. RADIATION DOSE REDUCTION: This exam was performed according to the departmental dose-optimization program which includes automated exposure control, adjustment of the mA and/or kV according to patient size and/or use of iterative reconstruction technique. COMPARISON:  None Available. FINDINGS: CT HEAD FINDINGS Brain: There is no evidence for acute hemorrhage, hydrocephalus, mass lesion, or abnormal extra-axial fluid collection. No definite CT evidence for acute infarction. Diffuse loss of parenchymal volume is consistent with atrophy. Vascular: No hyperdense vessel or unexpected calcification. Skull: No evidence for fracture. No worrisome lytic or sclerotic lesion. Sinuses/Orbits: The visualized  paranasal sinuses and mastoid air cells are clear. Visualized portions of the globes and intraorbital fat are unremarkable. Other: None. CT CERVICAL SPINE FINDINGS Alignment: Straightening of normal cervical lordosis evident. Skull base and vertebrae: No acute fracture. No primary bone lesion or focal pathologic process. Soft tissues and spinal canal: No prevertebral fluid or swelling. No visible canal hematoma. Disc levels: Anterior cervical discectomy with plating at C4-5 and C7-T1. Patient is fused across the C5-6 and C6-7 interspaces without hardware evident. Facet osteoarthritis noted upper cervical spine. Upper chest: Negative. Other: None. IMPRESSION: 1. No acute intracranial  abnormality. 2. No evidence for cervical spine fracture or subluxation. 3. Multilevel cervical fusion. Loss of cervical lordosis. This can be related to patient positioning, muscle spasm or soft tissue injury. Electronically Signed   By: Misty Stanley M.D.   On: 05/19/2022 12:20   CT Cervical Spine Wo Contrast  Result Date: 05/19/2022 CLINICAL DATA:  Fall with head laceration. EXAM: CT HEAD WITHOUT CONTRAST CT CERVICAL SPINE WITHOUT CONTRAST TECHNIQUE: Multidetector CT imaging of the head and cervical spine was performed following the standard protocol without intravenous contrast. Multiplanar CT image reconstructions of the cervical spine were also generated. RADIATION DOSE REDUCTION: This exam was performed according to the departmental dose-optimization program which includes automated exposure control, adjustment of the mA and/or kV according to patient size and/or use of iterative reconstruction technique. COMPARISON:  None Available. FINDINGS: CT HEAD FINDINGS Brain: There is no evidence for acute hemorrhage, hydrocephalus, mass lesion, or abnormal extra-axial fluid collection. No definite CT evidence for acute infarction. Diffuse loss of parenchymal volume is consistent with atrophy. Vascular: No hyperdense vessel or  unexpected calcification. Skull: No evidence for fracture. No worrisome lytic or sclerotic lesion. Sinuses/Orbits: The visualized paranasal sinuses and mastoid air cells are clear. Visualized portions of the globes and intraorbital fat are unremarkable. Other: None. CT CERVICAL SPINE FINDINGS Alignment: Straightening of normal cervical lordosis evident. Skull base and vertebrae: No acute fracture. No primary bone lesion or focal pathologic process. Soft tissues and spinal canal: No prevertebral fluid or swelling. No visible canal hematoma. Disc levels: Anterior cervical discectomy with plating at C4-5 and C7-T1. Patient is fused across the C5-6 and C6-7 interspaces without hardware evident. Facet osteoarthritis noted upper cervical spine. Upper chest: Negative. Other: None. IMPRESSION: 1. No acute intracranial abnormality. 2. No evidence for cervical spine fracture or subluxation. 3. Multilevel cervical fusion. Loss of cervical lordosis. This can be related to patient positioning, muscle spasm or soft tissue injury. Electronically Signed   By: Misty Stanley M.D.   On: 05/19/2022 12:20   IR KYPHO EA ADDL LEVEL THORACIC OR LUMBAR  Result Date: 05/07/2022 CLINICAL DATA:  76 year old gentleman with age related osteoporosis and current pathologic fracture of the L1 and L2 vertebral bodies; subsequent encounter for fracture with delayed healing. M80.08XG. 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 EXAM: FLUOROSCOPIC GUIDED KYPHOPLASTY OF THE L1 VERTEBRAL BODY FLUOROSCOPIC GUIDED KYPHOPLASTY OF THE L2 VERTEBRAL BODY FLUOROSCOPIC GUIDED RIGHT SACROILIAC JOINT INJECTION COMPARISON:  MRI lumbar spine 04/13/2022 MEDICATIONS: As antibiotic prophylaxis, 2 g Ancef was ordered pre-procedure and administered intravenously within 1 hour of incision. ANESTHESIA/SEDATION: Moderate (conscious) sedation was employed during this procedure. A total of Versed 3 mg and Fentanyl 150 mcg was  administered intravenously. Moderate Sedation Time: 47 minutes. The patient's level of consciousness and vital signs were monitored continuously by radiology nursing throughout the procedure under my direct supervision. FLUOROSCOPY TIME:  Radiation exposure index 71.4 mGy reference air kerma COMPLICATIONS: None immediate. PROCEDURE: The procedure, risks (including but not limited to bleeding, infection, organ damage), benefits, and alternatives were explained to the patient. Questions regarding the procedure were encouraged and answered. The patient understands and consents to the procedure. The patient has suffered a fracture of the L1 and L2 vertebral bodies. It is recommended that patients aged 13 years or older be evaluated for possible testing or treatment of osteoporosis. RIGHT SI INJECTION Attention was first turned to the left sacroiliac joint. Using fluoroscopy, the inferior aspect of the sacroiliac joint was identified. The overlying  skin was sterilely prepped and draped in the standard fashion using Betadine skin prep. Local anesthesia was attained by infiltration with 1% lidocaine. A 22 gauge curved spinal needle was carefully advanced under direct fluoroscopy into the joint. Confirmation the needle was into the joint was confirmed by injection of a small amount of contrast material under imaging guidance. 80 mg of Depo-Medrol and 1 mL 0.5% bupivacaine were then injected into the joint. The needle was removed. L1 AND L2 KP The patient was placed prone on the fluoroscopic table. The skin overlying the upper thoracic region was then prepped and draped in the usual sterile fashion. Maximal barrier sterile technique was utilized including caps, mask, sterile gowns, sterile gloves, sterile drape, hand hygiene and skin antiseptic. Intravenous Fentanyl and Versed were administered as conscious sedation during continuous cardiorespiratory monitoring by the radiology RN. The right pedicle at L1 was then  infiltrated with 1% lidocaine followed by the advancement of a Kyphon trocar needle through the left pedicle into the posterior one-third of the vertebral body. Subsequently, the osteo drill was advanced to the anterior third of the vertebral body. The osteo drill was retracted. Through the working cannula, a Kyphon inflatable bone tamp 15 x 2.5 was advanced and positioned with the distal marker approximately 5 mm from the anterior aspect of the cortex. Appropriate positioning was confirmed on the AP projection. At this time, the balloon was expanded using contrast via a Kyphon inflation syringe device via micro tubing. The left pedicle at L2 was then infiltrated with 1% lidocaine followed by the advancement of a Kyphon trocar needle through the left pedicle into the posterior one-third of the vertebral body. Subsequently, the osteo drill was advanced to the anterior third of the vertebral body. The osteo drill was retracted. Through the working cannula, a Kyphon inflatable bone tamp 15 x 2.5 was advanced and positioned with the distal marker approximately 5 mm from the anterior aspect of the cortex. Appropriate positioning was confirmed on the AP projection. At this time, the balloon was expanded using contrast via a Kyphon inflation syringe device via micro tubing. Inflations were continued until there was near apposition with the superior end plate. At this time, methylmethacrylate mixture was reconstituted in the Kyphon bone mixing device system. This was then loaded into the delivery mechanism, attached to the cement delivery system (CDS). The balloons were deflated and removed followed by the instillation of methylmethacrylate mixture with excellent filling in the AP and lateral projections. No extravasation was noted in the disk spaces or posteriorly into the spinal canal. No epidural venous contamination was seen. Excellent filling across the entirety of the superior endplate of L1. Excellent preferential  filling on the left the site of fracture at L2. The working cannulae and the bone filler were then retrieved and removed. Hemostasis was achieved with manual compression. The patient tolerated the procedure well without immediate postprocedural complication. IMPRESSION: 1. Technically successful L1 vertebral body augmentation using balloon kyphoplasty. 2. Technically successful L2 vertebral body augmentation using balloon kyphoplasty. 3. Technically successful right sacroiliac joint injection of steroid and local anesthetic. 4. Per CMS PQRS reporting requirements (PQRS Measure 24): Given the patient's age of greater than 86 and the fracture site (hip, distal radius, or spine), the patient should be tested for osteoporosis using DXA, and the appropriate treatment considered based on the DXA results. Electronically Signed   By: Jacqulynn Cadet M.D.   On: 05/07/2022 12:22   IR KYPHO LUMBAR INC FX REDUCE BONE BX UNI/BIL CANNULATION  INC/IMAGING  Result Date: 05/07/2022 CLINICAL DATA:  76 year old gentleman with age related osteoporosis and current pathologic fracture of the L1 and L2 vertebral bodies; subsequent encounter for fracture with delayed healing. M80.08XG. 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 EXAM: FLUOROSCOPIC GUIDED KYPHOPLASTY OF THE L1 VERTEBRAL BODY FLUOROSCOPIC GUIDED KYPHOPLASTY OF THE L2 VERTEBRAL BODY FLUOROSCOPIC GUIDED RIGHT SACROILIAC JOINT INJECTION COMPARISON:  MRI lumbar spine 04/13/2022 MEDICATIONS: As antibiotic prophylaxis, 2 g Ancef was ordered pre-procedure and administered intravenously within 1 hour of incision. ANESTHESIA/SEDATION: Moderate (conscious) sedation was employed during this procedure. A total of Versed 3 mg and Fentanyl 150 mcg was administered intravenously. Moderate Sedation Time: 47 minutes. The patient's level of consciousness and vital signs were monitored continuously by radiology nursing throughout the procedure  under my direct supervision. FLUOROSCOPY TIME:  Radiation exposure index 71.4 mGy reference air kerma COMPLICATIONS: None immediate. PROCEDURE: The procedure, risks (including but not limited to bleeding, infection, organ damage), benefits, and alternatives were explained to the patient. Questions regarding the procedure were encouraged and answered. The patient understands and consents to the procedure. The patient has suffered a fracture of the L1 and L2 vertebral bodies. It is recommended that patients aged 36 years or older be evaluated for possible testing or treatment of osteoporosis. RIGHT SI INJECTION Attention was first turned to the left sacroiliac joint. Using fluoroscopy, the inferior aspect of the sacroiliac joint was identified. The overlying skin was sterilely prepped and draped in the standard fashion using Betadine skin prep. Local anesthesia was attained by infiltration with 1% lidocaine. A 22 gauge curved spinal needle was carefully advanced under direct fluoroscopy into the joint. Confirmation the needle was into the joint was confirmed by injection of a small amount of contrast material under imaging guidance. 80 mg of Depo-Medrol and 1 mL 0.5% bupivacaine were then injected into the joint. The needle was removed. L1 AND L2 KP The patient was placed prone on the fluoroscopic table. The skin overlying the upper thoracic region was then prepped and draped in the usual sterile fashion. Maximal barrier sterile technique was utilized including caps, mask, sterile gowns, sterile gloves, sterile drape, hand hygiene and skin antiseptic. Intravenous Fentanyl and Versed were administered as conscious sedation during continuous cardiorespiratory monitoring by the radiology RN. The right pedicle at L1 was then infiltrated with 1% lidocaine followed by the advancement of a Kyphon trocar needle through the left pedicle into the posterior one-third of the vertebral body. Subsequently, the osteo drill was  advanced to the anterior third of the vertebral body. The osteo drill was retracted. Through the working cannula, a Kyphon inflatable bone tamp 15 x 2.5 was advanced and positioned with the distal marker approximately 5 mm from the anterior aspect of the cortex. Appropriate positioning was confirmed on the AP projection. At this time, the balloon was expanded using contrast via a Kyphon inflation syringe device via micro tubing. The left pedicle at L2 was then infiltrated with 1% lidocaine followed by the advancement of a Kyphon trocar needle through the left pedicle into the posterior one-third of the vertebral body. Subsequently, the osteo drill was advanced to the anterior third of the vertebral body. The osteo drill was retracted. Through the working cannula, a Kyphon inflatable bone tamp 15 x 2.5 was advanced and positioned with the distal marker approximately 5 mm from the anterior aspect of the cortex. Appropriate positioning was confirmed on the AP projection. At this time, the balloon was expanded using contrast via  a Kyphon inflation syringe device via micro tubing. Inflations were continued until there was near apposition with the superior end plate. At this time, methylmethacrylate mixture was reconstituted in the Kyphon bone mixing device system. This was then loaded into the delivery mechanism, attached to the cement delivery system (CDS). The balloons were deflated and removed followed by the instillation of methylmethacrylate mixture with excellent filling in the AP and lateral projections. No extravasation was noted in the disk spaces or posteriorly into the spinal canal. No epidural venous contamination was seen. Excellent filling across the entirety of the superior endplate of L1. Excellent preferential filling on the left the site of fracture at L2. The working cannulae and the bone filler were then retrieved and removed. Hemostasis was achieved with manual compression. The patient tolerated the  procedure well without immediate postprocedural complication. IMPRESSION: 1. Technically successful L1 vertebral body augmentation using balloon kyphoplasty. 2. Technically successful L2 vertebral body augmentation using balloon kyphoplasty. 3. Technically successful right sacroiliac joint injection of steroid and local anesthetic. 4. Per CMS PQRS reporting requirements (PQRS Measure 24): Given the patient's age of greater than 75 and the fracture site (hip, distal radius, or spine), the patient should be tested for osteoporosis using DXA, and the appropriate treatment considered based on the DXA results. Electronically Signed   By: Jacqulynn Cadet M.D.   On: 05/07/2022 12:22   DG Fluoro Guide Spinal/SI Jt Inj Left  Result Date: 05/07/2022 CLINICAL DATA:  77 year old gentleman with age related osteoporosis and current pathologic fracture of the L1 and L2 vertebral bodies; subsequent encounter for fracture with delayed healing. M80.08XG. 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 EXAM: FLUOROSCOPIC GUIDED KYPHOPLASTY OF THE L1 VERTEBRAL BODY FLUOROSCOPIC GUIDED KYPHOPLASTY OF THE L2 VERTEBRAL BODY FLUOROSCOPIC GUIDED RIGHT SACROILIAC JOINT INJECTION COMPARISON:  MRI lumbar spine 04/13/2022 MEDICATIONS: As antibiotic prophylaxis, 2 g Ancef was ordered pre-procedure and administered intravenously within 1 hour of incision. ANESTHESIA/SEDATION: Moderate (conscious) sedation was employed during this procedure. A total of Versed 3 mg and Fentanyl 150 mcg was administered intravenously. Moderate Sedation Time: 47 minutes. The patient's level of consciousness and vital signs were monitored continuously by radiology nursing throughout the procedure under my direct supervision. FLUOROSCOPY TIME:  Radiation exposure index 71.4 mGy reference air kerma COMPLICATIONS: None immediate. PROCEDURE: The procedure, risks (including but not limited to bleeding, infection, organ  damage), benefits, and alternatives were explained to the patient. Questions regarding the procedure were encouraged and answered. The patient understands and consents to the procedure. The patient has suffered a fracture of the L1 and L2 vertebral bodies. It is recommended that patients aged 15 years or older be evaluated for possible testing or treatment of osteoporosis. RIGHT SI INJECTION Attention was first turned to the left sacroiliac joint. Using fluoroscopy, the inferior aspect of the sacroiliac joint was identified. The overlying skin was sterilely prepped and draped in the standard fashion using Betadine skin prep. Local anesthesia was attained by infiltration with 1% lidocaine. A 22 gauge curved spinal needle was carefully advanced under direct fluoroscopy into the joint. Confirmation the needle was into the joint was confirmed by injection of a small amount of contrast material under imaging guidance. 80 mg of Depo-Medrol and 1 mL 0.5% bupivacaine were then injected into the joint. The needle was removed. L1 AND L2 KP The patient was placed prone on the fluoroscopic table. The skin overlying the upper thoracic region was then prepped and draped in the usual sterile fashion.  Maximal barrier sterile technique was utilized including caps, mask, sterile gowns, sterile gloves, sterile drape, hand hygiene and skin antiseptic. Intravenous Fentanyl and Versed were administered as conscious sedation during continuous cardiorespiratory monitoring by the radiology RN. The right pedicle at L1 was then infiltrated with 1% lidocaine followed by the advancement of a Kyphon trocar needle through the left pedicle into the posterior one-third of the vertebral body. Subsequently, the osteo drill was advanced to the anterior third of the vertebral body. The osteo drill was retracted. Through the working cannula, a Kyphon inflatable bone tamp 15 x 2.5 was advanced and positioned with the distal marker approximately 5 mm from  the anterior aspect of the cortex. Appropriate positioning was confirmed on the AP projection. At this time, the balloon was expanded using contrast via a Kyphon inflation syringe device via micro tubing. The left pedicle at L2 was then infiltrated with 1% lidocaine followed by the advancement of a Kyphon trocar needle through the left pedicle into the posterior one-third of the vertebral body. Subsequently, the osteo drill was advanced to the anterior third of the vertebral body. The osteo drill was retracted. Through the working cannula, a Kyphon inflatable bone tamp 15 x 2.5 was advanced and positioned with the distal marker approximately 5 mm from the anterior aspect of the cortex. Appropriate positioning was confirmed on the AP projection. At this time, the balloon was expanded using contrast via a Kyphon inflation syringe device via micro tubing. Inflations were continued until there was near apposition with the superior end plate. At this time, methylmethacrylate mixture was reconstituted in the Kyphon bone mixing device system. This was then loaded into the delivery mechanism, attached to the cement delivery system (CDS). The balloons were deflated and removed followed by the instillation of methylmethacrylate mixture with excellent filling in the AP and lateral projections. No extravasation was noted in the disk spaces or posteriorly into the spinal canal. No epidural venous contamination was seen. Excellent filling across the entirety of the superior endplate of L1. Excellent preferential filling on the left the site of fracture at L2. The working cannulae and the bone filler were then retrieved and removed. Hemostasis was achieved with manual compression. The patient tolerated the procedure well without immediate postprocedural complication. IMPRESSION: 1. Technically successful L1 vertebral body augmentation using balloon kyphoplasty. 2. Technically successful L2 vertebral body augmentation using balloon  kyphoplasty. 3. Technically successful right sacroiliac joint injection of steroid and local anesthetic. 4. Per CMS PQRS reporting requirements (PQRS Measure 24): Given the patient's age of greater than 21 and the fracture site (hip, distal radius, or spine), the patient should be tested for osteoporosis using DXA, and the appropriate treatment considered based on the DXA results. Electronically Signed   By: Jacqulynn Cadet M.D.   On: 05/07/2022 12:22    Labs:  CBC: Recent Labs    05/19/22 1124 05/20/22 0222  WBC 13.4* 13.2*  HGB 13.1 11.7*  HCT 36.9* 32.4*  PLT 254 188    COAGS: No results for input(s): "INR", "APTT" in the last 8760 hours.  BMP: Recent Labs    05/19/22 1124 05/20/22 0222 05/20/22 1218  NA 123* 123* 127*  [place] 4.2 3.9 4.0  CL 89* 87* 87*  CO2 '22 27 28  '$ GLUCOSE 136* 115* 108*  BUN '11 14 12  '$ CALCIUM 9.1 8.8* 9.4  CREATININE 0.67 0.63 0.73  GFRNONAA >60 >60 >60    LIVER FUNCTION TESTS: No results for input(s): "BILITOT", "AST", "ALT", "ALKPHOS", "PROT", "ALBUMIN"  in the last 8760 hours.  TUMOR MARKERS: No results for input(s): "AFPTM", "CEA", "CA199", "CHROMGRNA" in the last 8760 hours.  Assessment and Plan:  76 year old gentleman doing remarkably well status post combined L1 and L2 kyphoplasty as well as right sacroiliac joint injection.  He reports that the SI joint injection has remarkably helped his right SI joint.  Additionally, his back pain is feeling much better.  He continues to have pain along his left lower extremity from a fall he suffered the day before his procedure with me.  This seems to be healing albeit slowly.    Overall, he is much improved in very pleased.    No further scheduled follow-up visits.  We are always available if Mr. Agner develops a new fracture or requires additional right sacroiliac joint injections for recurrent symptoms in the future.      Electronically Signed: Criselda Peaches 05/30/2022, 1:25 PM   I  spent a total of    15 Minutes in remote  clinical consultation, greater than 50% of which was counseling/coordinating care for Right sacroiliac joint dysfunction, L1 and L2 compression fractures now status post cement augmentation with kyphoplasty  .    Visit type: Audio only (telephone). Audio (no video) only due to patient preference. Alternative for in-person consultation at [facility], [address], [place], [place]. This visit type was conducted due to national recommendations for restrictions regarding the COVID-19 Pandemic (e.g. social distancing).  This format is felt to be most appropriate for this patient at this time.  All issues noted in this document were discussed and addressed.

## 2022-06-10 DIAGNOSIS — E871 Hypo-osmolality and hyponatremia: Secondary | ICD-10-CM | POA: Diagnosis not present

## 2022-06-10 DIAGNOSIS — R000 Tachycardia, unspecified: Secondary | ICD-10-CM | POA: Diagnosis not present

## 2022-06-10 DIAGNOSIS — I10 Essential (primary) hypertension: Secondary | ICD-10-CM | POA: Diagnosis not present

## 2022-06-26 DIAGNOSIS — I10 Essential (primary) hypertension: Secondary | ICD-10-CM | POA: Diagnosis not present

## 2022-06-26 DIAGNOSIS — J019 Acute sinusitis, unspecified: Secondary | ICD-10-CM | POA: Diagnosis not present

## 2022-06-30 ENCOUNTER — Ambulatory Visit
Admission: RE | Admit: 2022-06-30 | Discharge: 2022-06-30 | Disposition: A | Discharge status: Home / Self Care | Payer: No Typology Code available for payment source | Source: Ambulatory Visit | Attending: Family Medicine | Admitting: Family Medicine

## 2022-06-30 VITALS — BP 145/72 | HR 109 | Temp 98.2°F | Resp 16

## 2022-06-30 DIAGNOSIS — J209 Acute bronchitis, unspecified: Secondary | ICD-10-CM | POA: Diagnosis not present

## 2022-06-30 DIAGNOSIS — J019 Acute sinusitis, unspecified: Secondary | ICD-10-CM

## 2022-06-30 MED ORDER — DOXYCYCLINE HYCLATE 100 MG PO CAPS
100.0000 mg | ORAL_CAPSULE | Freq: Two times a day (BID) | ORAL | 0 refills | Status: AC
Start: 2022-06-30 — End: 2022-07-07

## 2022-06-30 NOTE — ED Triage Notes (Signed)
Pt presents with c/o facial pain and nasal congestion for past week, states that  he took z pack last week with no relief , declines covid test

## 2022-06-30 NOTE — ED Provider Notes (Signed)
[facility]    CSN: [id number] Arrival date & time: 06/30/22  1108      History   Chief Complaint Chief Complaint  Patient presents with   Facial Pain    Sinus infection - Entered by patient   Nasal Congestion    HPI Joseph Howell is a 76 y.o. male.   HPI Patient presents for evaluation of URI symptoms and facial pressure with nasal congestion. Symptoms present for over 2 weeks now. He saw his PCP one week ago and was prescribed Azithromycin and reports no improvement of symptoms.  He completed entire course of medication.  Endorses some mild cough. Denies any shortness of breath, chest pain, and wheezing.   Past Medical History:  Diagnosis Date   Allergy    Arthritis    Diabetes mellitus without complication ([place])    Hyperlipidemia    Hypertension    Liver disease, unspecified     Patient Active Problem List   Diagnosis Date Noted   Near syncope 05/19/2022   Hyponatremia 05/19/2022   Controlled type 2 diabetes mellitus with complication, without long-term current use of insulin ([place]) 04/27/2021   Moderate mixed hyperlipidemia not requiring statin therapy 04/27/2021   Encounter to establish care 12/29/2020   Primary insomnia 12/29/2020   Primary hypertension 12/29/2020    Past Surgical History:  Procedure Laterality Date   [place]   C-5/6 & 6/7   CATARACT EXTRACTION  2004   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 12/21/2013   dupuytrens release R hand 02/06/21 Right    FOOT SURGERY Bilateral in the 90's   Plantar Fibromatosis   HAND SURGERY  [id number]    Multiple (Dupuytrens Contracture)   IR KYPHO EA ADDL LEVEL THORACIC OR LUMBAR  05/07/2022   IR KYPHO LUMBAR INC FX REDUCE BONE BX UNI/BIL CANNULATION INC/IMAGING  05/07/2022   IR RADIOLOGIST EVAL & MGMT  05/30/2022   KNEE ARTHROSCOPY WITH MEDIAL MENISECTOMY Left 2002   PIP JOINT FUSION Right 07/16/2019   ROTATOR CUFF REPAIR Left 2003   TONSILLECTOMY AND  ADENOIDECTOMY  1951   ULNAR TUNNEL RELEASE Left 03/03/2016   VASECTOMY  1986   XI ROBOTIC ASSISTED SIMPLE PROSTATECTOMY  02/19/2019       Home Medications    Prior to Admission medications   Medication Sig Start Date End Date Taking? Authorizing Provider  doxycycline (VIBRAMYCIN) 100 MG capsule Take 1 capsule (100 mg total) by mouth 2 (two) times daily for 7 days. 06/30/22 07/07/22 Yes Scot Jun, FNP  acetaminophen-codeine (TYLENOL #3) 300-30 MG tablet Take 1 tablet by mouth every 6 (six) hours as needed. 04/18/22   [provider]  atorvastatin (LIPITOR) 10 MG tablet Take 1 tablet (10 mg total) by mouth at bedtime. 03/27/21   Cletis Athens, MD  folic acid (FOLVITE) 1 MG tablet Take 1 tablet (1 mg total) by mouth daily. 05/21/22 05/16/23  Kayleen Memos, DO  gabapentin (NEURONTIN) 100 MG capsule Take 200 mg by mouth at bedtime.    [provider]  glucose blood (ONETOUCH VERIO) test strip CHECK BLOOD SUGAR ONCE DAILY BEFORE BREAKFAST 12/19/21   Cletis Athens, MD  HYDROcodone-acetaminophen (NORCO) 10-325 MG tablet Take 1 tablet by mouth every 6 (six) hours as needed. 04/25/22   Tyson Alias, NP  Lancets (ONETOUCH DELICA PLUS [id number]) MISC CHECK ONE TIME DAILY BEFORE BREAKFAST 12/19/21   Cletis Athens, MD  levothyroxine (SYNTHROID) 25 MCG tablet Take 25 mcg by mouth daily before breakfast.  [provider]  metFORMIN (GLUCOPHAGE) 500 MG tablet Take 1 tablet (500 mg total) by mouth daily. 03/27/21   Cletis Athens, MD  metoprolol tartrate (LOPRESSOR) 25 MG tablet Take 0.5 tablets (12.5 mg total) by mouth 2 (two) times daily. 05/20/22 06/19/22  Kayleen Memos, DO  Multiple Vitamin (MULTIVITAMIN WITH MINERALS) TABS tablet Take 1 tablet by mouth daily. 05/21/22 05/16/23  Kayleen Memos, DO  thiamine 100 MG tablet Take 1 tablet (100 mg total) by mouth daily. 05/21/22 08/19/22  Kayleen Memos, DO    Family History Family History  Adopted: Yes  Family history unknown: Yes     Social History Social History   Tobacco Use   Smoking status: Never   Smokeless tobacco: Never  Vaping Use   Vaping Use: Never used  Substance Use Topics   Alcohol use: Yes    Comment: 2 daily   Drug use: Never     Allergies   Patient has no active allergies.   Review of Systems Review of Systems Pertinent negatives listed in HPI   Physical Exam Triage Vital Signs ED Triage Vitals  Enc Vitals Group     BP 06/30/22 1138 (!) 145/72     Pulse Rate 06/30/22 1138 (!) 109     Resp 06/30/22 1138 16     Temp 06/30/22 1138 98.2 F (36.8 C)     Temp Source 06/30/22 1138 Temporal     SpO2 06/30/22 1138 96 %     Weight --      Height --      Head Circumference --      Peak Flow --      Pain Score 06/30/22 1143 4     Pain Loc --      Pain Edu? --      Excl. in [place]? --    No data found.  Updated Vital Signs BP (!) 145/72 (BP Location: Left Arm)   Pulse (!) 109   Temp 98.2 F (36.8 C) (Temporal)   Resp 16   SpO2 96%   Visual Acuity Right Eye Distance:   Left Eye Distance:   Bilateral Distance:    Right Eye Near:   Left Eye Near:    Bilateral Near:     Physical Exam Constitutional:      Appearance: Normal appearance.  HENT:     Head: Normocephalic and atraumatic.     Nose: Congestion and rhinorrhea present.  Eyes:     Extraocular Movements: Extraocular movements intact.     Pupils: Pupils are equal, round, and reactive to light.  Cardiovascular:     Rate and Rhythm: Normal rate and regular rhythm.  Pulmonary:     Effort: Pulmonary effort is normal.     Breath sounds: Normal breath sounds.  Musculoskeletal:     Cervical back: Normal range of motion and neck supple.  Skin:    General: Skin is warm and dry.     Capillary Refill: Capillary refill takes less than 2 seconds.  Neurological:     General: No focal deficit present.     Mental Status: He is alert and oriented to person, place, and time.  Psychiatric:        Mood and Affect: Mood normal.         Behavior: Behavior normal.        Thought Content: Thought content normal.        Judgment: Judgment normal.    UC Treatments / Results  Labs (  all labs ordered are listed, but only abnormal results are displayed) Labs Reviewed - No data to display  EKG   Radiology No results found.  Procedures Procedures (including critical care time)  Medications Ordered in UC Medications - No data to display  Initial Impression / Assessment and Plan / UC Course  I have reviewed the triage vital signs and the nursing notes.  Pertinent labs & imaging results that were available during my care of the patient were reviewed by me and considered in my medical decision making (see chart for details).    Agreed to retreat with a course of Doxycyline for 7 days.   Follow-up with PCP if symptoms worsen or do not improve. Final Clinical Impressions(s) / UC Diagnoses   Final diagnoses:  Acute non-recurrent sinusitis, unspecified location  Acute bronchitis, unspecified organism   Discharge Instructions   None    ED Prescriptions     Medication Sig Dispense Auth. Provider   doxycycline (VIBRAMYCIN) 100 MG capsule Take 1 capsule (100 mg total) by mouth 2 (two) times daily for 7 days. 14 capsule Scot Jun, FNP      PDMP not reviewed this encounter.   Scot Jun, FNP 07/02/22 1510

## 2022-07-09 ENCOUNTER — Ambulatory Visit: Payer: No Typology Code available for payment source

## 2022-07-23 DIAGNOSIS — E1140 Type 2 diabetes mellitus with diabetic neuropathy, unspecified: Secondary | ICD-10-CM | POA: Diagnosis not present

## 2022-07-23 DIAGNOSIS — G603 Idiopathic progressive neuropathy: Secondary | ICD-10-CM | POA: Diagnosis not present

## 2022-07-23 DIAGNOSIS — M79662 Pain in left lower leg: Secondary | ICD-10-CM | POA: Diagnosis not present

## 2022-07-23 DIAGNOSIS — Z23 Encounter for immunization: Secondary | ICD-10-CM | POA: Diagnosis not present

## 2022-07-23 DIAGNOSIS — I10 Essential (primary) hypertension: Secondary | ICD-10-CM | POA: Diagnosis not present

## 2022-07-23 DIAGNOSIS — M79604 Pain in right leg: Secondary | ICD-10-CM | POA: Diagnosis not present

## 2022-07-23 DIAGNOSIS — E1142 Type 2 diabetes mellitus with diabetic polyneuropathy: Secondary | ICD-10-CM | POA: Diagnosis not present

## 2022-07-23 DIAGNOSIS — M79661 Pain in right lower leg: Secondary | ICD-10-CM | POA: Diagnosis not present

## 2022-07-23 DIAGNOSIS — Z13220 Encounter for screening for lipoid disorders: Secondary | ICD-10-CM | POA: Diagnosis not present

## 2022-07-23 DIAGNOSIS — M79605 Pain in left leg: Secondary | ICD-10-CM | POA: Diagnosis not present

## 2022-07-23 DIAGNOSIS — R000 Tachycardia, unspecified: Secondary | ICD-10-CM | POA: Diagnosis not present

## 2022-07-23 DIAGNOSIS — E1159 Type 2 diabetes mellitus with other circulatory complications: Secondary | ICD-10-CM | POA: Diagnosis not present

## 2022-07-23 DIAGNOSIS — I739 Peripheral vascular disease, unspecified: Secondary | ICD-10-CM | POA: Diagnosis not present

## 2022-08-02 DIAGNOSIS — E1140 Type 2 diabetes mellitus with diabetic neuropathy, unspecified: Secondary | ICD-10-CM | POA: Diagnosis not present

## 2022-08-02 DIAGNOSIS — I739 Peripheral vascular disease, unspecified: Secondary | ICD-10-CM | POA: Diagnosis not present

## 2022-08-02 DIAGNOSIS — E559 Vitamin D deficiency, unspecified: Secondary | ICD-10-CM | POA: Diagnosis not present

## 2022-08-02 DIAGNOSIS — I10 Essential (primary) hypertension: Secondary | ICD-10-CM | POA: Diagnosis not present

## 2022-08-02 DIAGNOSIS — E039 Hypothyroidism, unspecified: Secondary | ICD-10-CM | POA: Diagnosis not present

## 2022-08-02 DIAGNOSIS — E7800 Pure hypercholesterolemia, unspecified: Secondary | ICD-10-CM | POA: Diagnosis not present

## 2022-08-06 DIAGNOSIS — D519 Vitamin B12 deficiency anemia, unspecified: Secondary | ICD-10-CM | POA: Diagnosis not present

## 2022-11-01 DIAGNOSIS — E559 Vitamin D deficiency, unspecified: Secondary | ICD-10-CM | POA: Diagnosis not present

## 2022-11-01 DIAGNOSIS — E119 Type 2 diabetes mellitus without complications: Secondary | ICD-10-CM | POA: Diagnosis not present

## 2022-11-01 DIAGNOSIS — E039 Hypothyroidism, unspecified: Secondary | ICD-10-CM | POA: Diagnosis not present

## 2022-11-01 DIAGNOSIS — E1140 Type 2 diabetes mellitus with diabetic neuropathy, unspecified: Secondary | ICD-10-CM | POA: Diagnosis not present

## 2022-11-01 DIAGNOSIS — R799 Abnormal finding of blood chemistry, unspecified: Secondary | ICD-10-CM | POA: Diagnosis not present

## 2022-11-01 DIAGNOSIS — I10 Essential (primary) hypertension: Secondary | ICD-10-CM | POA: Diagnosis not present

## 2022-11-01 DIAGNOSIS — G479 Sleep disorder, unspecified: Secondary | ICD-10-CM | POA: Diagnosis not present

## 2022-11-01 DIAGNOSIS — D519 Vitamin B12 deficiency anemia, unspecified: Secondary | ICD-10-CM | POA: Diagnosis not present

## 2022-11-01 DIAGNOSIS — E7800 Pure hypercholesterolemia, unspecified: Secondary | ICD-10-CM | POA: Diagnosis not present

## 2022-11-01 DIAGNOSIS — E785 Hyperlipidemia, unspecified: Secondary | ICD-10-CM | POA: Diagnosis not present

## 2022-12-05 DIAGNOSIS — G479 Sleep disorder, unspecified: Secondary | ICD-10-CM | POA: Diagnosis not present

## 2022-12-05 DIAGNOSIS — Z79899 Other long term (current) drug therapy: Secondary | ICD-10-CM | POA: Diagnosis not present

## 2023-01-31 DIAGNOSIS — E039 Hypothyroidism, unspecified: Secondary | ICD-10-CM | POA: Diagnosis not present

## 2023-01-31 DIAGNOSIS — I10 Essential (primary) hypertension: Secondary | ICD-10-CM | POA: Diagnosis not present

## 2023-01-31 DIAGNOSIS — R5383 Other fatigue: Secondary | ICD-10-CM | POA: Diagnosis not present

## 2023-01-31 DIAGNOSIS — R799 Abnormal finding of blood chemistry, unspecified: Secondary | ICD-10-CM | POA: Diagnosis not present

## 2023-01-31 DIAGNOSIS — H8103 Meniere's disease, bilateral: Secondary | ICD-10-CM | POA: Diagnosis not present

## 2023-01-31 DIAGNOSIS — E785 Hyperlipidemia, unspecified: Secondary | ICD-10-CM | POA: Diagnosis not present

## 2023-01-31 DIAGNOSIS — E559 Vitamin D deficiency, unspecified: Secondary | ICD-10-CM | POA: Diagnosis not present

## 2023-01-31 DIAGNOSIS — E7800 Pure hypercholesterolemia, unspecified: Secondary | ICD-10-CM | POA: Diagnosis not present

## 2023-01-31 DIAGNOSIS — E1140 Type 2 diabetes mellitus with diabetic neuropathy, unspecified: Secondary | ICD-10-CM | POA: Diagnosis not present

## 2023-03-10 DIAGNOSIS — H8103 Meniere's disease, bilateral: Secondary | ICD-10-CM | POA: Diagnosis not present

## 2023-03-10 DIAGNOSIS — M461 Sacroiliitis, not elsewhere classified: Secondary | ICD-10-CM | POA: Diagnosis not present

## 2023-03-10 DIAGNOSIS — I10 Essential (primary) hypertension: Secondary | ICD-10-CM | POA: Diagnosis not present

## 2023-03-10 DIAGNOSIS — I739 Peripheral vascular disease, unspecified: Secondary | ICD-10-CM | POA: Diagnosis not present

## 2023-03-10 DIAGNOSIS — G4700 Insomnia, unspecified: Secondary | ICD-10-CM | POA: Diagnosis not present

## 2023-03-10 DIAGNOSIS — E1140 Type 2 diabetes mellitus with diabetic neuropathy, unspecified: Secondary | ICD-10-CM | POA: Diagnosis not present

## 2023-04-24 DIAGNOSIS — M545 Low back pain, unspecified: Secondary | ICD-10-CM | POA: Diagnosis not present

## 2023-04-24 DIAGNOSIS — M461 Sacroiliitis, not elsewhere classified: Secondary | ICD-10-CM | POA: Diagnosis not present

## 2023-04-24 DIAGNOSIS — M47816 Spondylosis without myelopathy or radiculopathy, lumbar region: Secondary | ICD-10-CM | POA: Diagnosis not present

## 2023-04-28 ENCOUNTER — Ambulatory Visit: Payer: No Typology Code available for payment source

## 2023-04-30 ENCOUNTER — Ambulatory Visit: Payer: No Typology Code available for payment source

## 2023-05-02 DIAGNOSIS — D519 Vitamin B12 deficiency anemia, unspecified: Secondary | ICD-10-CM | POA: Diagnosis not present

## 2023-05-02 DIAGNOSIS — Z79899 Other long term (current) drug therapy: Secondary | ICD-10-CM | POA: Diagnosis not present

## 2023-05-02 DIAGNOSIS — E7800 Pure hypercholesterolemia, unspecified: Secondary | ICD-10-CM | POA: Diagnosis not present

## 2023-05-02 DIAGNOSIS — F4310 Post-traumatic stress disorder, unspecified: Secondary | ICD-10-CM | POA: Diagnosis not present

## 2023-05-02 DIAGNOSIS — E1140 Type 2 diabetes mellitus with diabetic neuropathy, unspecified: Secondary | ICD-10-CM | POA: Diagnosis not present

## 2023-05-02 DIAGNOSIS — R5383 Other fatigue: Secondary | ICD-10-CM | POA: Diagnosis not present

## 2023-05-02 DIAGNOSIS — G4700 Insomnia, unspecified: Secondary | ICD-10-CM | POA: Diagnosis not present

## 2023-05-02 DIAGNOSIS — E559 Vitamin D deficiency, unspecified: Secondary | ICD-10-CM | POA: Diagnosis not present

## 2023-05-02 DIAGNOSIS — E119 Type 2 diabetes mellitus without complications: Secondary | ICD-10-CM | POA: Diagnosis not present

## 2023-05-02 DIAGNOSIS — R5381 Other malaise: Secondary | ICD-10-CM | POA: Diagnosis not present

## 2023-05-02 DIAGNOSIS — M461 Sacroiliitis, not elsewhere classified: Secondary | ICD-10-CM | POA: Diagnosis not present

## 2023-05-02 DIAGNOSIS — I739 Peripheral vascular disease, unspecified: Secondary | ICD-10-CM | POA: Diagnosis not present

## 2023-05-02 DIAGNOSIS — R799 Abnormal finding of blood chemistry, unspecified: Secondary | ICD-10-CM | POA: Diagnosis not present

## 2023-05-02 DIAGNOSIS — I10 Essential (primary) hypertension: Secondary | ICD-10-CM | POA: Diagnosis not present

## 2023-05-02 DIAGNOSIS — E039 Hypothyroidism, unspecified: Secondary | ICD-10-CM | POA: Diagnosis not present

## 2023-05-21 DIAGNOSIS — D2262 Melanocytic nevi of left upper limb, including shoulder: Secondary | ICD-10-CM | POA: Diagnosis not present

## 2023-05-21 DIAGNOSIS — D2261 Melanocytic nevi of right upper limb, including shoulder: Secondary | ICD-10-CM | POA: Diagnosis not present

## 2023-05-21 DIAGNOSIS — L578 Other skin changes due to chronic exposure to nonionizing radiation: Secondary | ICD-10-CM | POA: Diagnosis not present

## 2023-05-21 DIAGNOSIS — X32XXXA Exposure to sunlight, initial encounter: Secondary | ICD-10-CM | POA: Diagnosis not present

## 2023-05-21 DIAGNOSIS — D225 Melanocytic nevi of trunk: Secondary | ICD-10-CM | POA: Diagnosis not present

## 2023-05-21 DIAGNOSIS — L570 Actinic keratosis: Secondary | ICD-10-CM | POA: Diagnosis not present

## 2023-06-03 DIAGNOSIS — M47816 Spondylosis without myelopathy or radiculopathy, lumbar region: Secondary | ICD-10-CM | POA: Diagnosis not present

## 2023-07-01 DIAGNOSIS — M47816 Spondylosis without myelopathy or radiculopathy, lumbar region: Secondary | ICD-10-CM | POA: Diagnosis not present

## 2023-07-14 DIAGNOSIS — E039 Hypothyroidism, unspecified: Secondary | ICD-10-CM | POA: Diagnosis not present

## 2023-07-14 DIAGNOSIS — R399 Unspecified symptoms and signs involving the genitourinary system: Secondary | ICD-10-CM | POA: Diagnosis not present

## 2023-07-14 DIAGNOSIS — G4700 Insomnia, unspecified: Secondary | ICD-10-CM | POA: Diagnosis not present

## 2023-07-14 DIAGNOSIS — G8929 Other chronic pain: Secondary | ICD-10-CM | POA: Diagnosis not present

## 2023-07-14 DIAGNOSIS — M545 Low back pain, unspecified: Secondary | ICD-10-CM | POA: Diagnosis not present

## 2023-07-14 DIAGNOSIS — E7800 Pure hypercholesterolemia, unspecified: Secondary | ICD-10-CM | POA: Diagnosis not present

## 2023-07-14 DIAGNOSIS — M542 Cervicalgia: Secondary | ICD-10-CM | POA: Diagnosis not present

## 2023-07-14 DIAGNOSIS — I10 Essential (primary) hypertension: Secondary | ICD-10-CM | POA: Diagnosis not present

## 2023-07-14 DIAGNOSIS — E119 Type 2 diabetes mellitus without complications: Secondary | ICD-10-CM | POA: Diagnosis not present

## 2023-07-22 DIAGNOSIS — M47816 Spondylosis without myelopathy or radiculopathy, lumbar region: Secondary | ICD-10-CM | POA: Diagnosis not present

## 2023-08-21 DIAGNOSIS — M47816 Spondylosis without myelopathy or radiculopathy, lumbar region: Secondary | ICD-10-CM | POA: Diagnosis not present

## 2023-10-14 DIAGNOSIS — G4700 Insomnia, unspecified: Secondary | ICD-10-CM | POA: Diagnosis not present

## 2023-10-14 DIAGNOSIS — M542 Cervicalgia: Secondary | ICD-10-CM | POA: Diagnosis not present

## 2023-10-14 DIAGNOSIS — R399 Unspecified symptoms and signs involving the genitourinary system: Secondary | ICD-10-CM | POA: Diagnosis not present

## 2023-10-14 DIAGNOSIS — I10 Essential (primary) hypertension: Secondary | ICD-10-CM | POA: Diagnosis not present

## 2023-10-14 DIAGNOSIS — E039 Hypothyroidism, unspecified: Secondary | ICD-10-CM | POA: Diagnosis not present

## 2023-10-14 DIAGNOSIS — G8929 Other chronic pain: Secondary | ICD-10-CM | POA: Diagnosis not present

## 2023-10-14 DIAGNOSIS — Z23 Encounter for immunization: Secondary | ICD-10-CM | POA: Diagnosis not present

## 2023-10-14 DIAGNOSIS — E7800 Pure hypercholesterolemia, unspecified: Secondary | ICD-10-CM | POA: Diagnosis not present

## 2023-10-14 DIAGNOSIS — E119 Type 2 diabetes mellitus without complications: Secondary | ICD-10-CM | POA: Diagnosis not present

## 2023-10-14 DIAGNOSIS — M545 Low back pain, unspecified: Secondary | ICD-10-CM | POA: Diagnosis not present

## 2023-11-03 DIAGNOSIS — E119 Type 2 diabetes mellitus without complications: Secondary | ICD-10-CM | POA: Diagnosis not present

## 2023-12-19 ENCOUNTER — Other Ambulatory Visit: Payer: Self-pay | Admitting: Internal Medicine

## 2023-12-19 DIAGNOSIS — M545 Low back pain, unspecified: Secondary | ICD-10-CM

## 2023-12-23 ENCOUNTER — Ambulatory Visit
Admission: RE | Admit: 2023-12-23 | Discharge: 2023-12-23 | Disposition: A | Discharge status: Home / Self Care | Payer: No Typology Code available for payment source | Source: Ambulatory Visit | Attending: Internal Medicine | Admitting: Internal Medicine

## 2023-12-23 DIAGNOSIS — M545 Low back pain, unspecified: Secondary | ICD-10-CM

## 2023-12-26 ENCOUNTER — Ambulatory Visit
Admission: RE | Admit: 2023-12-26 | Discharge: 2023-12-26 | Disposition: A | Discharge status: Home / Self Care | Payer: No Typology Code available for payment source | Source: Ambulatory Visit | Attending: Internal Medicine | Admitting: Internal Medicine

## 2023-12-26 DIAGNOSIS — M545 Low back pain, unspecified: Secondary | ICD-10-CM | POA: Insufficient documentation

## 2024-09-28 ENCOUNTER — Other Ambulatory Visit: Payer: Self-pay

## 2024-09-28 DIAGNOSIS — M549 Dorsalgia, unspecified: Secondary | ICD-10-CM

## 2024-09-29 ENCOUNTER — Ambulatory Visit: Admitting: Internal Medicine

## 2024-10-05 ENCOUNTER — Ambulatory Visit

## 2024-10-09 ENCOUNTER — Ambulatory Visit: Admission: RE | Admit: 2024-10-09 | Discharge: 2024-10-09 | Disposition: A | Source: Ambulatory Visit

## 2024-10-09 DIAGNOSIS — M549 Dorsalgia, unspecified: Secondary | ICD-10-CM | POA: Diagnosis present
